# Patient Record
Sex: Female | Born: 1955
Health system: Southern US, Community
[De-identification: ages and names within clinical notes are randomized; demographics above are authoritative.]

## PROBLEM LIST (undated history)

## (undated) DIAGNOSIS — F32A Depression, unspecified: Secondary | ICD-10-CM

## (undated) DIAGNOSIS — Z862 Personal history of diseases of the blood and blood-forming organs and certain disorders involving the immune mechanism: Secondary | ICD-10-CM

## (undated) DIAGNOSIS — C73 Malignant neoplasm of thyroid gland: Secondary | ICD-10-CM

## (undated) DIAGNOSIS — F419 Anxiety disorder, unspecified: Secondary | ICD-10-CM

## (undated) DIAGNOSIS — R1084 Generalized abdominal pain: Secondary | ICD-10-CM

## (undated) DIAGNOSIS — J189 Pneumonia, unspecified organism: Secondary | ICD-10-CM

## (undated) DIAGNOSIS — N289 Disorder of kidney and ureter, unspecified: Secondary | ICD-10-CM

## (undated) DIAGNOSIS — M1990 Unspecified osteoarthritis, unspecified site: Secondary | ICD-10-CM

## (undated) DIAGNOSIS — F40240 Claustrophobia: Secondary | ICD-10-CM

## (undated) DIAGNOSIS — F329 Major depressive disorder, single episode, unspecified: Secondary | ICD-10-CM

## (undated) DIAGNOSIS — J449 Chronic obstructive pulmonary disease, unspecified: Secondary | ICD-10-CM

## (undated) DIAGNOSIS — E079 Disorder of thyroid, unspecified: Secondary | ICD-10-CM

## (undated) DIAGNOSIS — C801 Malignant (primary) neoplasm, unspecified: Secondary | ICD-10-CM

## (undated) DIAGNOSIS — R7303 Prediabetes: Secondary | ICD-10-CM

## (undated) DIAGNOSIS — N800 Endometriosis of uterus: Secondary | ICD-10-CM

## (undated) DIAGNOSIS — K219 Gastro-esophageal reflux disease without esophagitis: Secondary | ICD-10-CM

## (undated) DIAGNOSIS — S62101A Fracture of unspecified carpal bone, right wrist, initial encounter for closed fracture: Secondary | ICD-10-CM

## (undated) DIAGNOSIS — I700 Atherosclerosis of aorta: Secondary | ICD-10-CM

## (undated) HISTORY — PX: ABDOMINAL HYSTERECTOMY: SHX81

## (undated) HISTORY — PX: COLONOSCOPY W/ POLYPECTOMY: SHX1380

## (undated) HISTORY — PX: TOTAL THYROIDECTOMY: SHX2547

## (undated) HISTORY — PX: FRACTURE SURGERY: SHX138

## (undated) HISTORY — PX: TUBAL LIGATION: SHX77

---
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
## 1999-10-06 ENCOUNTER — Ambulatory Visit (HOSPITAL_COMMUNITY): Admission: RE | Admit: 1999-10-06 | Discharge: 1999-10-06 | Payer: Self-pay | Admitting: Surgery

## 1999-10-06 ENCOUNTER — Encounter: Payer: Self-pay | Admitting: Surgery

## 2005-02-17 ENCOUNTER — Other Ambulatory Visit: Admission: RE | Admit: 2005-02-17 | Discharge: 2005-02-17 | Payer: Self-pay | Admitting: Gynecology

## 2005-05-06 ENCOUNTER — Encounter: Admission: RE | Admit: 2005-05-06 | Discharge: 2005-05-06 | Payer: Self-pay | Admitting: Specialist

## 2005-05-16 ENCOUNTER — Other Ambulatory Visit: Admission: RE | Admit: 2005-05-16 | Discharge: 2005-05-16 | Payer: Self-pay | Admitting: Interventional Radiology

## 2005-05-16 ENCOUNTER — Encounter: Admission: RE | Admit: 2005-05-16 | Discharge: 2005-05-16 | Payer: Self-pay | Admitting: Family Medicine

## 2005-06-20 ENCOUNTER — Ambulatory Visit (HOSPITAL_COMMUNITY): Admission: RE | Admit: 2005-06-20 | Discharge: 2005-06-21 | Payer: Self-pay | Admitting: General Surgery

## 2005-08-04 ENCOUNTER — Ambulatory Visit (HOSPITAL_COMMUNITY): Admission: RE | Admit: 2005-08-04 | Discharge: 2005-08-05 | Payer: Self-pay | Admitting: General Surgery

## 2005-10-03 ENCOUNTER — Encounter: Admission: RE | Admit: 2005-10-03 | Discharge: 2005-10-03 | Payer: Self-pay | Admitting: Endocrinology

## 2005-10-13 ENCOUNTER — Encounter: Admission: RE | Admit: 2005-10-13 | Discharge: 2005-10-13 | Payer: Self-pay | Admitting: Endocrinology

## 2005-10-20 ENCOUNTER — Encounter: Admission: RE | Admit: 2005-10-20 | Discharge: 2005-10-20 | Payer: Self-pay | Admitting: Endocrinology

## 2006-12-13 IMAGING — US US SOFT TISSUE HEAD/NECK
1 series · 14 of 25 positions shown · non-contrast
Comparison: none

CLINICAL DATA: Thyroid lesion noted on MR. 
ULTRASOUND SOFT TISSUE, HEAD AND NECK:
Scans over the thyroid gland were performed.  The thyroid gland is within the upper limits of normal.  The right lobe measures 5.5 cm sagittally with a depth of 1.4 cm and width of 2.0 cm.  The left lobe measures 6.2 x 2.5 x 3.1 cm.  The isthmus measures 3.5 mm.  However, within the left mid and lower lobe, there is a rounded mass present which is slightly hypoechoic.  This mass measures 3.8 x 2.0 x 2.9 cm and could represent a complex cyst but, biopsy of this dominant mass is recommended.

[Series 1: unknown · 0.06mm/px · 14 of 36 slices shown]
[im 1/36]
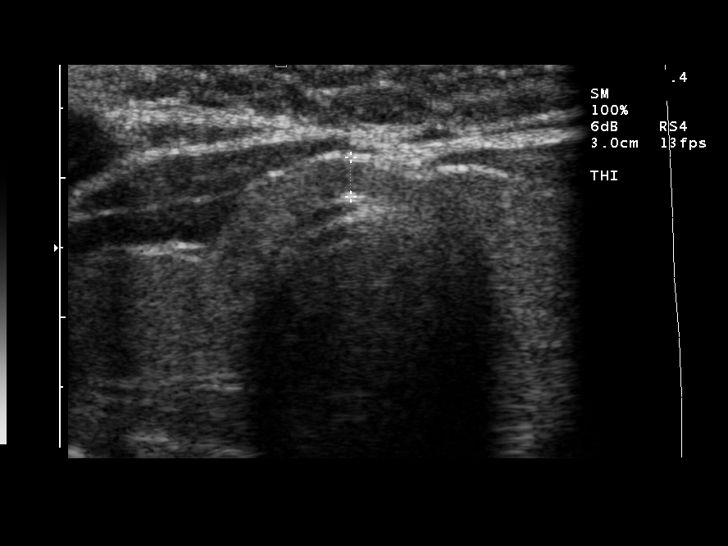
[im 3/36]
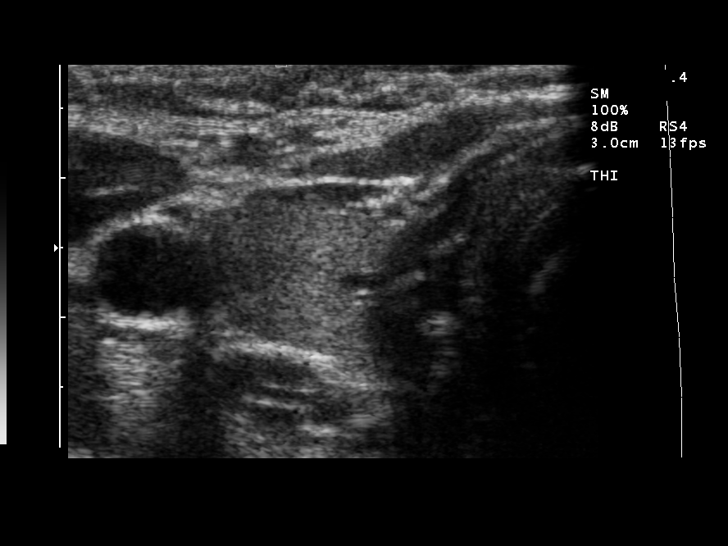
[im 6/36]
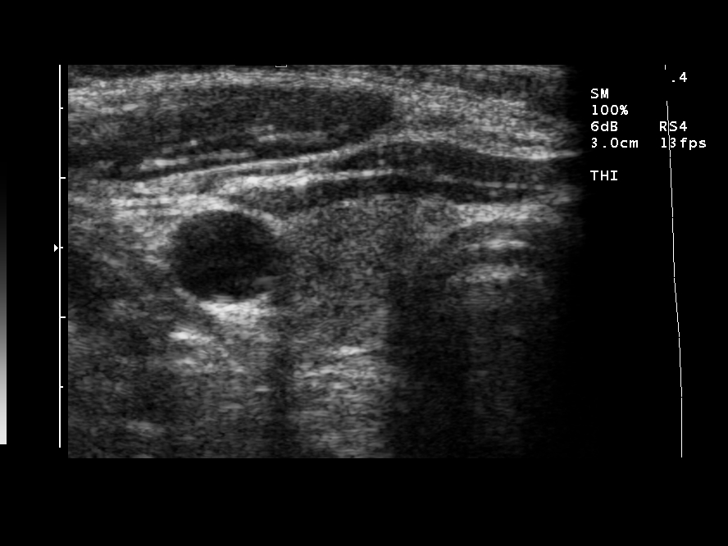
[im 9/36]
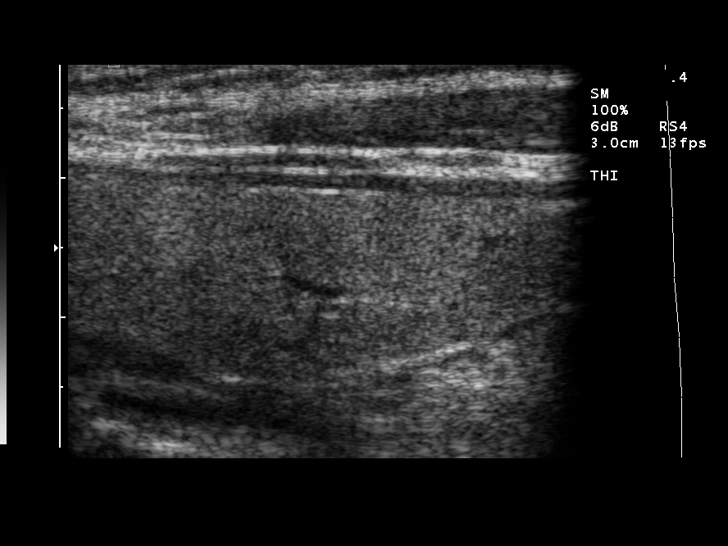
[im 12/36]
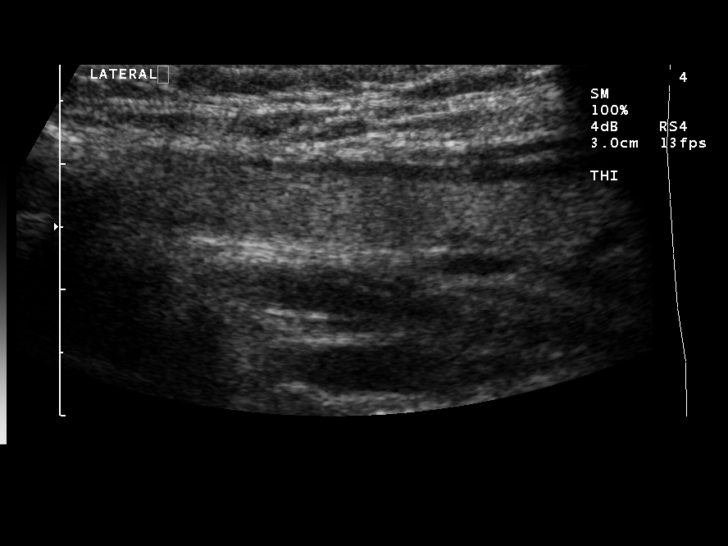
[im 14/36]
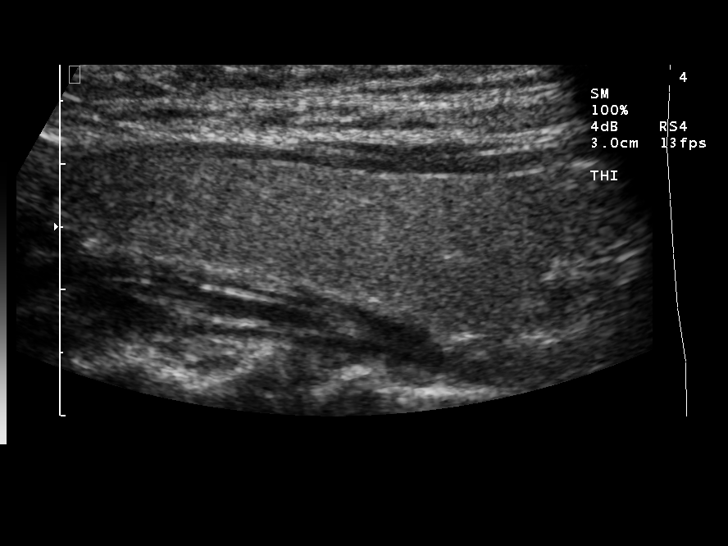
[im 17/36]
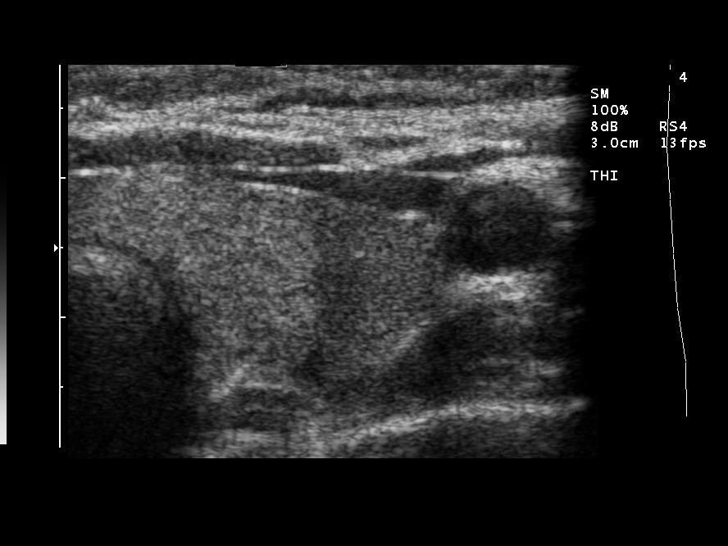
[im 19/36]
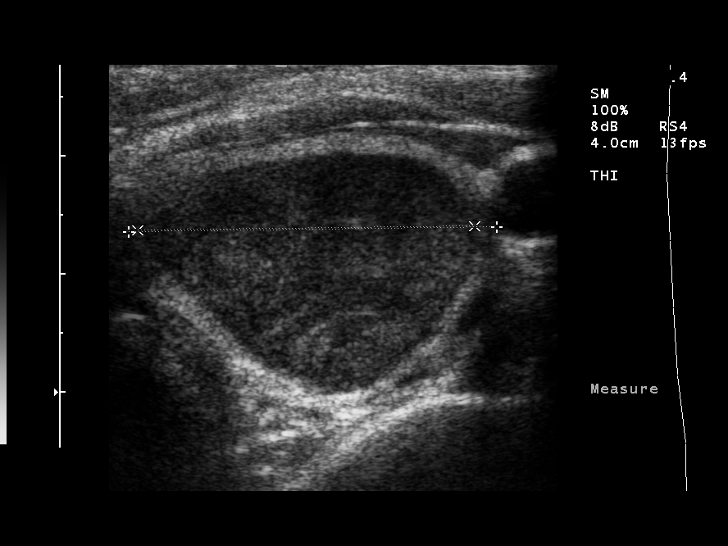
[im 22/36]
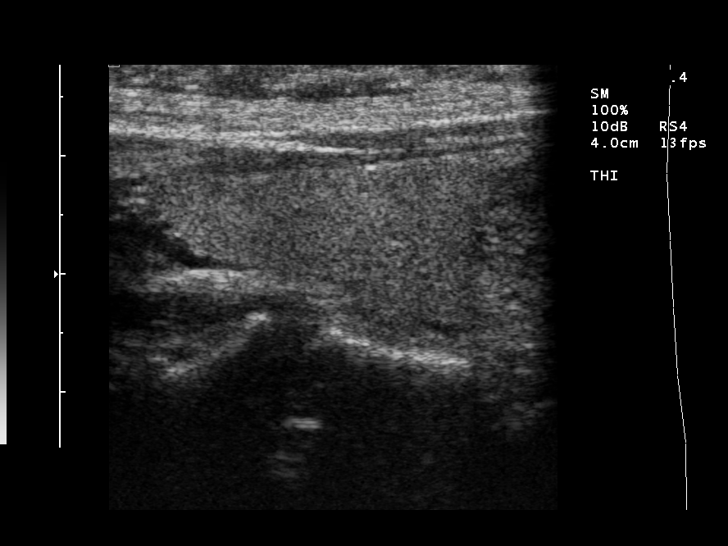
[im 24/36]
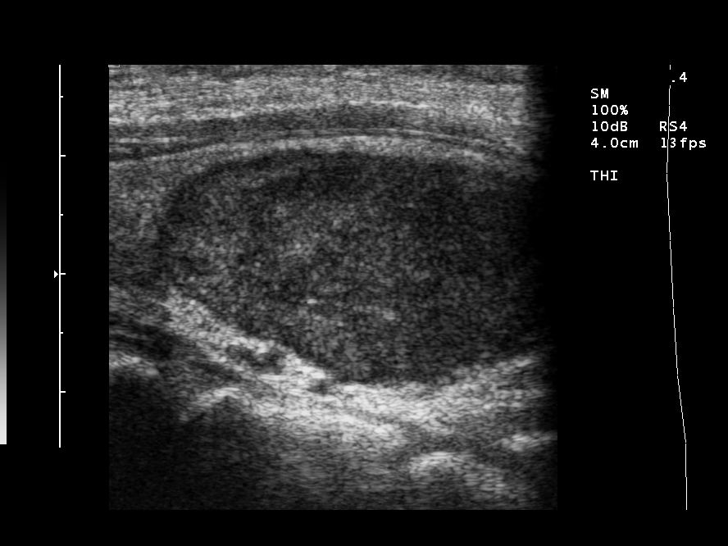
[im 27/36]
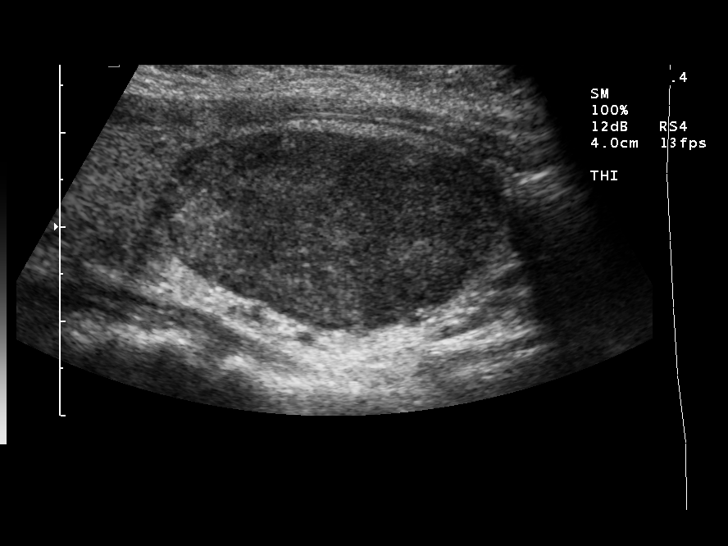
[im 30/36]
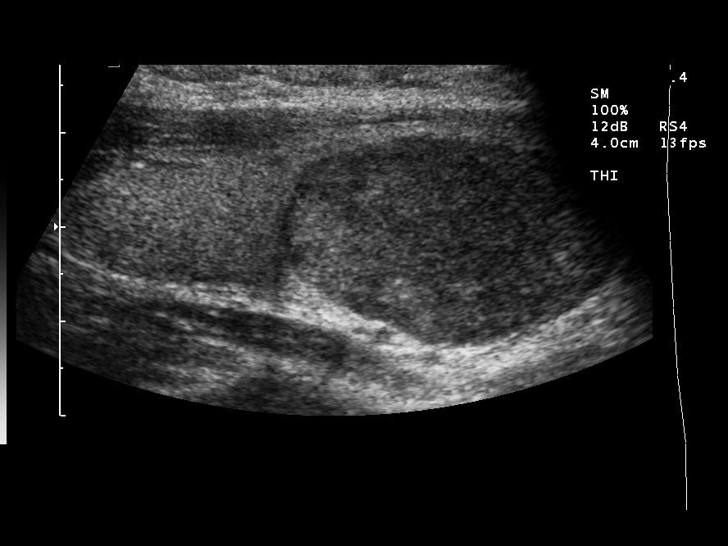
[im 33/36]
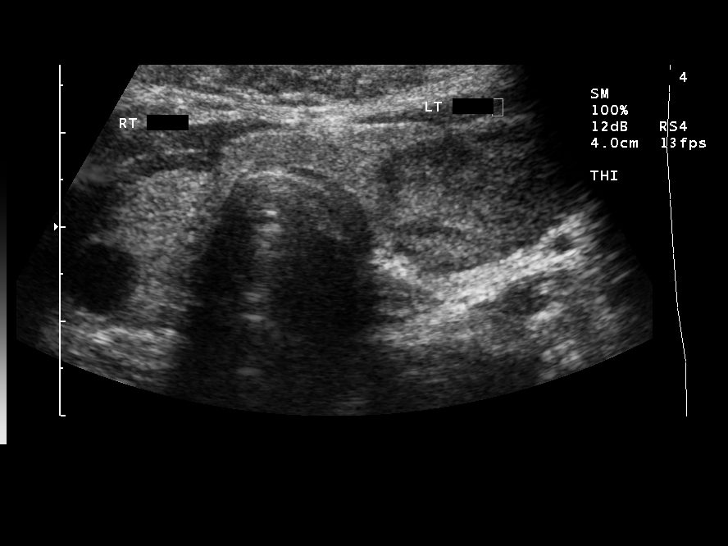
[im 36/36]
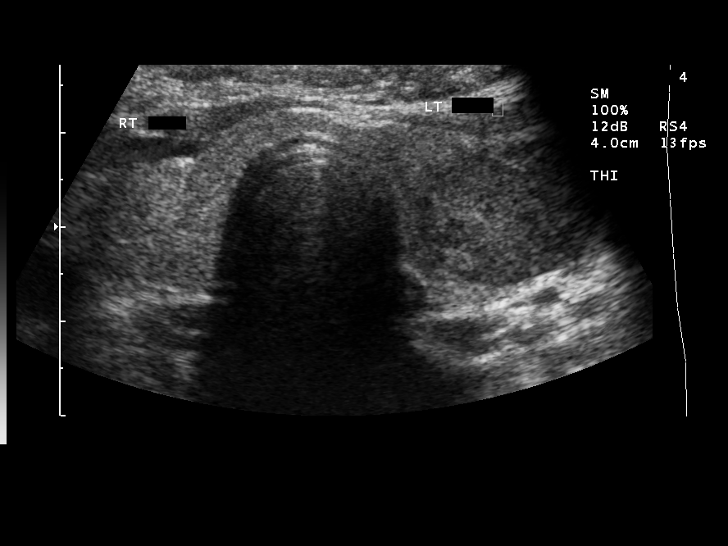

[14 of 25 positions shown; findings below may reference images not displayed]

IMPRESSION: 3.8 x 2.0 x 2.9 cm mass in the left lower pole of the thyroid. Suggest biopsy.

## 2010-08-15 ENCOUNTER — Encounter: Payer: Self-pay | Admitting: Endocrinology

## 2014-07-25 DIAGNOSIS — K5792 Diverticulitis of intestine, part unspecified, without perforation or abscess without bleeding: Secondary | ICD-10-CM

## 2014-07-25 HISTORY — DX: Diverticulitis of intestine, part unspecified, without perforation or abscess without bleeding: K57.92

## 2014-10-21 ENCOUNTER — Other Ambulatory Visit: Payer: Self-pay | Admitting: Family Medicine

## 2014-10-21 DIAGNOSIS — N644 Mastodynia: Secondary | ICD-10-CM

## 2014-10-30 ENCOUNTER — Other Ambulatory Visit: Payer: Self-pay | Admitting: Family Medicine

## 2014-10-30 DIAGNOSIS — N644 Mastodynia: Secondary | ICD-10-CM

## 2014-11-13 ENCOUNTER — Ambulatory Visit
Admission: RE | Admit: 2014-11-13 | Discharge: 2014-11-13 | Disposition: A | Discharge status: Home / Self Care | Payer: BLUE CROSS/BLUE SHIELD | Source: Ambulatory Visit | Attending: Family Medicine | Admitting: Family Medicine

## 2014-11-13 ENCOUNTER — Other Ambulatory Visit: Payer: Self-pay

## 2014-11-13 DIAGNOSIS — N644 Mastodynia: Secondary | ICD-10-CM

## 2015-02-28 ENCOUNTER — Emergency Department (HOSPITAL_COMMUNITY)
Admission: EM | Admit: 2015-02-28 | Discharge: 2015-02-28 | Disposition: A | Discharge status: Home / Self Care | Payer: BLUE CROSS/BLUE SHIELD | Attending: Emergency Medicine | Admitting: Emergency Medicine

## 2015-02-28 ENCOUNTER — Encounter (HOSPITAL_COMMUNITY): Payer: Self-pay | Admitting: *Deleted

## 2015-02-28 ENCOUNTER — Emergency Department (HOSPITAL_COMMUNITY): Payer: BLUE CROSS/BLUE SHIELD

## 2015-02-28 DIAGNOSIS — M545 Low back pain: Secondary | ICD-10-CM | POA: Diagnosis not present

## 2015-02-28 DIAGNOSIS — K5792 Diverticulitis of intestine, part unspecified, without perforation or abscess without bleeding: Secondary | ICD-10-CM | POA: Diagnosis not present

## 2015-02-28 DIAGNOSIS — Z720 Tobacco use: Secondary | ICD-10-CM | POA: Insufficient documentation

## 2015-02-28 DIAGNOSIS — M1990 Unspecified osteoarthritis, unspecified site: Secondary | ICD-10-CM | POA: Diagnosis not present

## 2015-02-28 DIAGNOSIS — R509 Fever, unspecified: Secondary | ICD-10-CM | POA: Diagnosis present

## 2015-02-28 DIAGNOSIS — Z79899 Other long term (current) drug therapy: Secondary | ICD-10-CM | POA: Insufficient documentation

## 2015-02-28 DIAGNOSIS — Z87448 Personal history of other diseases of urinary system: Secondary | ICD-10-CM | POA: Insufficient documentation

## 2015-02-28 DIAGNOSIS — Z8639 Personal history of other endocrine, nutritional and metabolic disease: Secondary | ICD-10-CM | POA: Diagnosis not present

## 2015-02-28 DIAGNOSIS — R109 Unspecified abdominal pain: Secondary | ICD-10-CM

## 2015-02-28 HISTORY — DX: Disorder of kidney and ureter, unspecified: N28.9

## 2015-02-28 HISTORY — DX: Disorder of thyroid, unspecified: E07.9

## 2015-02-28 HISTORY — DX: Unspecified osteoarthritis, unspecified site: M19.90

## 2015-02-28 LAB — URINE MICROSCOPIC-ADD ON

## 2015-02-28 LAB — BASIC METABOLIC PANEL
ANION GAP: 10 (ref 5–15)
BUN: 10 mg/dL (ref 6–20)
CALCIUM: 8.9 mg/dL (ref 8.9–10.3)
CHLORIDE: 102 mmol/L (ref 101–111)
CO2: 22 mmol/L (ref 22–32)
Creatinine, Ser: 0.89 mg/dL (ref 0.44–1.00)
GLUCOSE: 157 mg/dL — AB (ref 65–99)
POTASSIUM: 3.7 mmol/L (ref 3.5–5.1)
Sodium: 134 mmol/L — ABNORMAL LOW (ref 135–145)

## 2015-02-28 LAB — URINALYSIS, ROUTINE W REFLEX MICROSCOPIC
Glucose, UA: NEGATIVE mg/dL
KETONES UR: 15 mg/dL — AB
LEUKOCYTES UA: NEGATIVE
NITRITE: NEGATIVE
PH: 5.5 (ref 5.0–8.0)
Protein, ur: NEGATIVE mg/dL
SPECIFIC GRAVITY, URINE: 1.021 (ref 1.005–1.030)
UROBILINOGEN UA: 0.2 mg/dL (ref 0.0–1.0)

## 2015-02-28 LAB — CBC WITH DIFFERENTIAL/PLATELET
BASOS ABS: 0 10*3/uL (ref 0.0–0.1)
Basophils Relative: 0 % (ref 0–1)
EOS ABS: 0 10*3/uL (ref 0.0–0.7)
EOS PCT: 0 % (ref 0–5)
HEMATOCRIT: 40.6 % (ref 36.0–46.0)
Hemoglobin: 13.8 g/dL (ref 12.0–15.0)
LYMPHS PCT: 5 % — AB (ref 12–46)
Lymphs Abs: 1.1 10*3/uL (ref 0.7–4.0)
MCH: 29.7 pg (ref 26.0–34.0)
MCHC: 34 g/dL (ref 30.0–36.0)
MCV: 87.5 fL (ref 78.0–100.0)
MONO ABS: 0.7 10*3/uL (ref 0.1–1.0)
Monocytes Relative: 3 % (ref 3–12)
NEUTROS PCT: 92 % — AB (ref 43–77)
Neutro Abs: 19.2 10*3/uL — ABNORMAL HIGH (ref 1.7–7.7)
Platelets: 326 10*3/uL (ref 150–400)
RBC: 4.64 MIL/uL (ref 3.87–5.11)
RDW: 15 % (ref 11.5–15.5)
WBC: 21 10*3/uL — AB (ref 4.0–10.5)

## 2015-02-28 LAB — I-STAT CG4 LACTIC ACID, ED: Lactic Acid, Venous: 0.82 mmol/L (ref 0.5–2.0)

## 2015-02-28 MED ORDER — IOHEXOL 300 MG/ML  SOLN
25.0000 mL | Freq: Once | INTRAMUSCULAR | Status: AC | PRN
  Administered 2015-02-28: 25 mL via ORAL

## 2015-02-28 MED ORDER — ACETAMINOPHEN 325 MG PO TABS
650.0000 mg | ORAL_TABLET | Freq: Once | ORAL | Status: AC
  Administered 2015-02-28: 650 mg via ORAL
  Filled 2015-02-28: qty 2

## 2015-02-28 MED ORDER — HYDROCODONE-ACETAMINOPHEN 5-325 MG PO TABS: 1.0000 | ORAL_TABLET | ORAL | Status: DC | PRN

## 2015-02-28 MED ORDER — METRONIDAZOLE 500 MG PO TABS: 500.0000 mg | ORAL_TABLET | Freq: Two times a day (BID) | ORAL | Status: DC

## 2015-02-28 MED ORDER — SODIUM CHLORIDE 0.9 % IV BOLUS (SEPSIS)
1000.0000 mL | Freq: Once | INTRAVENOUS | Status: AC
  Administered 2015-02-28: 1000 mL via INTRAVENOUS

## 2015-02-28 MED ORDER — ONDANSETRON HCL 4 MG PO TABS: 4.0000 mg | ORAL_TABLET | Freq: Four times a day (QID) | ORAL | Status: DC

## 2015-02-28 MED ORDER — CIPROFLOXACIN HCL 500 MG PO TABS: 500.0000 mg | ORAL_TABLET | Freq: Two times a day (BID) | ORAL | Status: DC

## 2015-02-28 MED ORDER — MORPHINE SULFATE 4 MG/ML IJ SOLN
4.0000 mg | Freq: Once | INTRAMUSCULAR | Status: AC
  Administered 2015-02-28: 4 mg via INTRAVENOUS
  Filled 2015-02-28: qty 1

## 2015-02-28 MED ORDER — IOHEXOL 300 MG/ML  SOLN
100.0000 mL | Freq: Once | INTRAMUSCULAR | Status: AC | PRN
  Administered 2015-02-28: 100 mL via INTRAVENOUS

## 2015-02-28 MED ORDER — ONDANSETRON HCL 4 MG/2ML IJ SOLN
4.0000 mg | Freq: Once | INTRAMUSCULAR | Status: AC
  Administered 2015-02-28: 4 mg via INTRAVENOUS
  Filled 2015-02-28: qty 2

## 2015-02-28 MED ORDER — ONDANSETRON 4 MG PO TBDP
4.0000 mg | ORAL_TABLET | Freq: Once | ORAL | Status: AC
  Administered 2015-02-28: 4 mg via ORAL
  Filled 2015-02-28: qty 1

## 2015-02-28 MED ORDER — METRONIDAZOLE 500 MG PO TABS
500.0000 mg | ORAL_TABLET | Freq: Once | ORAL | Status: AC
Start: 2015-02-28 — End: 2015-02-28
  Administered 2015-02-28: 500 mg via ORAL
  Filled 2015-02-28: qty 1

## 2015-02-28 MED ORDER — CIPROFLOXACIN HCL 500 MG PO TABS
500.0000 mg | ORAL_TABLET | Freq: Once | ORAL | Status: AC
  Administered 2015-02-28: 500 mg via ORAL
  Filled 2015-02-28: qty 1

## 2015-02-28 NOTE — ED Provider Notes (Signed)
CSN: [id number]     Arrival date & time 02/28/15  1429 History   First MD Initiated Contact with Patient 02/28/15 1511     Chief Complaint  Patient presents with  . Fever  . Back Pain     (Consider location/radiation/quality/duration/timing/severity/associated sxs/prior Treatment) HPI   PCP: No primary care provider on file. Blood pressure 140/54, pulse 115, temperature 99.7 F (37.6 C), temperature source Oral, resp. rate 18, SpO2 97 %.  Kimberly Barnett is a 59 y.o.female without any significant PMH presents to the ER with complaints of lower back pain, lower abdominal pain, nausea, hot flashes, chills, and dark urine . She has a history of hematuria, assumed from NSAID use, and pyelonephritis. She reports this feels like pyelo.  The symptoms started today and is sharp and intermittent crampy sensations. She describes the pain has sometimes a 3/10 and then at its highest a 7/10. She does not have a urologist or nephrologist here in [place].  The patient denies diaphoresis, vaginal discharge, fever, headache, weakness (general or focal), confusion, change of vision,  neck pain, dysphagia, aphagia, chest pain, shortness of breath,  back pain, vomiting, diarrhea, lower extremity swelling, rash.  Past Medical History  Diagnosis Date  . Renal disorder   . Thyroid disease   . Arthritis    History reviewed. No pertinent past surgical history. History reviewed. No pertinent family history. History  Substance Use Topics  . Smoking status: Current Every Day Smoker    Types: Cigarettes  . Smokeless tobacco: Not on file  . Alcohol Use: Yes     Comment: occ   OB History    No data available     Review of Systems  10 Systems reviewed and are negative for acute change except as noted in the HPI.   Allergies  Nsaids  Home Medications   Prior to Admission medications   Medication Sig Start Date End Date Taking? Authorizing Provider  acetaminophen (TYLENOL) 650 MG CR tablet Take  1,300 mg by mouth every 8 (eight) hours as needed for pain.   Yes Historical Provider, MD  b complex vitamins tablet Take 1 tablet by mouth daily.   Yes Historical Provider, MD  Cholecalciferol (VITAMIN D3) 5000 UNITS TABS Take 5,000 Units by mouth daily.   Yes Historical Provider, MD  Cyanocobalamin (B-12 PO) Take 1 tablet by mouth daily.   Yes Historical Provider, MD  estradiol (VIVELLE-DOT) 0.0375 MG/24HR Place 1 patch onto the skin 2 (two) times a week. Nancy Fetter and wed changes 02/19/15  Yes Historical Provider, MD  Probiotic CAPS Take 1-2 capsules by mouth daily.   Yes Historical Provider, MD  SYNTHROID 125 MCG tablet Take 125 mcg by mouth daily. 02/11/15  Yes Historical Provider, MD  VITAMIN A PO Take 1 capsule by mouth daily.   Yes Historical Provider, MD  VITAMIN K PO Take 1 tablet by mouth daily.   Yes Historical Provider, MD  Wheat Dextrin (BENEFIBER DRINK MIX PO) Take 1 Package by mouth daily.   Yes Historical Provider, MD  ciprofloxacin (CIPRO) 500 MG tablet Take 1 tablet (500 mg total) by mouth 2 (two) times daily. 02/28/15   Delos Haring, PA-C  HYDROcodone-acetaminophen (NORCO/VICODIN) 5-325 MG per tablet Take 1-2 tablets by mouth every 4 (four) hours as needed. 02/28/15   Lei Dower Carlota Raspberry, PA-C  metroNIDAZOLE (FLAGYL) 500 MG tablet Take 1 tablet (500 mg total) by mouth 2 (two) times daily. 02/28/15   Delos Haring, PA-C  ondansetron (ZOFRAN) 4 MG tablet Take  1 tablet (4 mg total) by mouth every 6 (six) hours. 02/28/15   Kip Cropp Carlota Raspberry, PA-C   BP 115/73 mmHg  Pulse 95  Temp(Src) 99.7 F (37.6 C) (Oral)  Resp 20  SpO2 92% Physical Exam  Constitutional: She appears well-developed and well-nourished. No distress.  HENT:  Head: Normocephalic and atraumatic.  Eyes: Pupils are equal, round, and reactive to light.  Neck: Normal range of motion. Neck supple.  Cardiovascular: Normal rate and regular rhythm.   Pulmonary/Chest: Effort normal.  Abdominal: Soft. Bowel sounds are normal. There is  tenderness in the suprapubic area. There is CVA tenderness. There is no rigidity, no rebound and no guarding.  Neurological: She is alert.  Skin: Skin is warm and dry.  Nursing note and vitals reviewed.  ED Course  Procedures (including critical care time) Labs Review Labs Reviewed  URINALYSIS, ROUTINE W REFLEX MICROSCOPIC (NOT AT [facility]) - Abnormal; Notable for the following:    Color, Urine AMBER (*)    APPearance TURBID (*)    Hgb urine dipstick SMALL (*)    Bilirubin Urine SMALL (*)    Ketones, ur 15 (*)    All other components within normal limits  CBC WITH DIFFERENTIAL/PLATELET - Abnormal; Notable for the following:    WBC 21.0 (*)    Neutrophils Relative % 92 (*)    Neutro Abs 19.2 (*)    Lymphocytes Relative 5 (*)    All other components within normal limits  BASIC METABOLIC PANEL - Abnormal; Notable for the following:    Sodium 134 (*)    Glucose, Bld 157 (*)    All other components within normal limits  URINE MICROSCOPIC-ADD ON - Abnormal; Notable for the following:    Squamous Epithelial / LPF FEW (*)    Bacteria, UA FEW (*)    All other components within normal limits  I-STAT CG4 LACTIC ACID, ED    Imaging Review Ct Abdomen Pelvis W Contrast  02/28/2015   CLINICAL DATA:  Fever, chills, nausea, vomiting, lower abdominal pain, and low back pain since 0900 am this morning.Hx: Thyroid disease  EXAM: CT ABDOMEN AND PELVIS WITH CONTRAST  TECHNIQUE: Multidetector CT imaging of the abdomen and pelvis was performed using the standard protocol following bolus administration of intravenous contrast.  CONTRAST:  166mL OMNIPAQUE IOHEXOL 300 MG/ML  SOLN  COMPARISON:  None.  FINDINGS: Lung bases:  Clear.  Heart normal in size.  Liver, spleen, gallbladder:  Normal.  Pancreas: Well-defined oval 16 mm cyst in the tail the pancreas. No other pancreatic masses or lesions. No pancreatic inflammation.  Adrenal glands: Left adrenal gland is enlarged. There are heterogeneous masses enlarging both  limbs, the anterior limb measuring 2.4 cm in the posterior medial and 2.3 cm. These are intermediate in attenuation, the anterior limb measuring 25 Hounsfield units and posteriorly measuring 44 Hounsfield units. Both show significant washout on the delayed sequence, the anterior limb measuring 17.5 Hounsfield units in the posterior limb 14 Hounsfield units. This strongly supports both of these as being adenomas with washout of greater in 60% for each lesion. Normal right adrenal gland.  Kidneys, ureters, bladder:  Normal.  Uterus and adnexa:  Uterus surgically absent.  No adnexal masses.  Lymph nodes:  No enlarged lymph nodes.  Ascites:  None.  Gastrointestinal: There is mild hazy opacity in the pericolonic fat just posterior to the cecum with a few small nodular opacities consistent with sub cm lymph nodes. The wall of the cecum in this location appears mildly  thickened and irregular. No discrete diverticulum is seen. The appendix is retrocecal and separate from the apparent inflammation. It is normal in size containing air and demonstrating a thin wall. There are few left colon diverticula without evidence of diverticulitis. Colon otherwise unremarkable. Stomach and small bowel are unremarkable.  Musculoskeletal: Mild degenerative changes of the visualized spine. Grade 1 anterolisthesis of L4 on L5. No osteoblastic or osteolytic lesions.  IMPRESSION: 1. Mild inflammatory changes noted along the posterior margin of the cecum. The etiology of this is unclear. Localize infection or inflammation is suspected. This could reflect mild right colon diverticulitis although no discrete diverticulum is seen. 2. Normal appendix visualized. 3. No other evidence of an acute abnormality. 4. Left adrenal masses consistent with adenomas based on their rapid washout of intravenous contrast. 5. Several left colon diverticula without additional areas of inflammation. 6. Status post hysterectomy.   Electronically Signed   By: Lajean Manes M.D.   On: 02/28/2015 17:45     EKG Interpretation None      MDM   Final diagnoses:  Abdominal pain  Diverticulitis of intestine without perforation or abscess without bleeding    The patient's blood work showed an elevated white blood cell count, negative lactic acid. Her glucose is slightly elevated at 157. Her urine shows small amount of hemoglobin and ketones but no significant findings suggestive of UTI. Her CT scan shows possible focal infection, radiologist suspects could be diverticulitis although no discrete diverticulum noticed. Patient has no other acute findings within the abdomen. She'll be referred to a gastroenterologist  Medications  sodium chloride 0.9 % bolus 1,000 mL (1,000 mLs Intravenous New Bag/Given 02/28/15 1611)  ondansetron (ZOFRAN) injection 4 mg (4 mg Intravenous Given 02/28/15 1611)  morphine 4 MG/ML injection 4 mg (4 mg Intravenous Given 02/28/15 1610)  acetaminophen (TYLENOL) tablet 650 mg (650 mg Oral Given 02/28/15 1611)  iohexol (OMNIPAQUE) 300 MG/ML solution 25 mL (25 mLs Oral Contrast Given 02/28/15 1651)  iohexol (OMNIPAQUE) 300 MG/ML solution 100 mL (100 mLs Intravenous Contrast Given 02/28/15 1712)  ciprofloxacin (CIPRO) tablet 500 mg (500 mg Oral Given 02/28/15 1835)  metroNIDAZOLE (FLAGYL) tablet 500 mg (500 mg Oral Given 02/28/15 1835)  ondansetron (ZOFRAN-ODT) disintegrating tablet 4 mg (4 mg Oral Given 02/28/15 1835)    59 y.o.Eryn Marandola Sermons's evaluation in the Emergency Department is complete. It has been determined that no acute conditions requiring further emergency intervention are present at this time. The patient/guardian have been advised of the diagnosis and plan. We have discussed signs and symptoms that warrant return to the ED, such as changes or worsening in symptoms.  Vital signs are stable at discharge. Filed Vitals:   02/28/15 1815  BP: 115/73  Pulse: 95  Temp:   Resp:     Patient/guardian has voiced understanding and agreed  to follow-up with the PCP or specialist.     Delos Haring, PA-C 02/28/15 1850  Evelina Bucy, MD 02/28/15 2317

## 2015-02-28 NOTE — Discharge Instructions (Signed)

## 2015-02-28 NOTE — ED Notes (Signed)
Pt reports onset today of lower back pain and lower abd pain, having chills and fever today.

## 2015-04-06 ENCOUNTER — Other Ambulatory Visit: Payer: Self-pay | Admitting: Gastroenterology

## 2015-05-06 ENCOUNTER — Other Ambulatory Visit: Payer: Self-pay | Admitting: Gastroenterology

## 2015-05-06 NOTE — Addendum Note (Signed)
Addended by: Arta Silence on: 05/06/2015 11:39 AM   Modules accepted: Orders

## 2015-05-14 ENCOUNTER — Encounter (HOSPITAL_COMMUNITY): Payer: Self-pay | Admitting: *Deleted

## 2015-05-19 ENCOUNTER — Other Ambulatory Visit: Payer: Self-pay | Admitting: Gastroenterology

## 2015-05-20 ENCOUNTER — Ambulatory Visit (HOSPITAL_COMMUNITY): Payer: BLUE CROSS/BLUE SHIELD | Admitting: Anesthesiology

## 2015-05-20 ENCOUNTER — Encounter (HOSPITAL_COMMUNITY)
Admission: RE | Disposition: A | Discharge status: Home / Self Care | Payer: Self-pay | Source: Ambulatory Visit | Attending: Gastroenterology

## 2015-05-20 ENCOUNTER — Ambulatory Visit (HOSPITAL_COMMUNITY)
Admission: RE | Admit: 2015-05-20 | Discharge: 2015-05-20 | Disposition: A | Discharge status: Home / Self Care | Payer: BLUE CROSS/BLUE SHIELD | Source: Ambulatory Visit | Attending: Gastroenterology | Admitting: Gastroenterology

## 2015-05-20 ENCOUNTER — Encounter (HOSPITAL_COMMUNITY): Payer: Self-pay

## 2015-05-20 DIAGNOSIS — M479 Spondylosis, unspecified: Secondary | ICD-10-CM | POA: Diagnosis not present

## 2015-05-20 DIAGNOSIS — K862 Cyst of pancreas: Secondary | ICD-10-CM | POA: Diagnosis not present

## 2015-05-20 DIAGNOSIS — M19072 Primary osteoarthritis, left ankle and foot: Secondary | ICD-10-CM | POA: Insufficient documentation

## 2015-05-20 DIAGNOSIS — J449 Chronic obstructive pulmonary disease, unspecified: Secondary | ICD-10-CM | POA: Diagnosis not present

## 2015-05-20 DIAGNOSIS — M19071 Primary osteoarthritis, right ankle and foot: Secondary | ICD-10-CM | POA: Insufficient documentation

## 2015-05-20 DIAGNOSIS — M19042 Primary osteoarthritis, left hand: Secondary | ICD-10-CM | POA: Diagnosis not present

## 2015-05-20 DIAGNOSIS — Z79899 Other long term (current) drug therapy: Secondary | ICD-10-CM | POA: Insufficient documentation

## 2015-05-20 DIAGNOSIS — M19041 Primary osteoarthritis, right hand: Secondary | ICD-10-CM | POA: Diagnosis not present

## 2015-05-20 DIAGNOSIS — F17210 Nicotine dependence, cigarettes, uncomplicated: Secondary | ICD-10-CM | POA: Diagnosis not present

## 2015-05-20 DIAGNOSIS — R1909 Other intra-abdominal and pelvic swelling, mass and lump: Secondary | ICD-10-CM | POA: Diagnosis present

## 2015-05-20 HISTORY — PX: FINE NEEDLE ASPIRATION: SHX5430

## 2015-05-20 HISTORY — PX: EUS: SHX5427

## 2015-05-20 HISTORY — DX: Malignant (primary) neoplasm, unspecified: C80.1

## 2015-05-20 LAB — PANC CYST FLD ANLYS-PATHFNDR-TG

## 2015-05-20 SURGERY — ESOPHAGEAL ENDOSCOPIC ULTRASOUND (EUS) RADIAL
Anesthesia: Monitor Anesthesia Care

## 2015-05-20 MED ORDER — PROPOFOL 500 MG/50ML IV EMUL
INTRAVENOUS | Status: DC | PRN
  Administered 2015-05-20: 140 ug/kg/min via INTRAVENOUS

## 2015-05-20 MED ORDER — PROMETHAZINE HCL 25 MG/ML IJ SOLN: 6.2500 mg | INTRAMUSCULAR | Status: DC | PRN

## 2015-05-20 MED ORDER — FENTANYL CITRATE (PF) 100 MCG/2ML IJ SOLN
INTRAMUSCULAR | Status: DC | PRN
  Administered 2015-05-20: 50 ug via INTRAVENOUS

## 2015-05-20 MED ORDER — FENTANYL CITRATE (PF) 100 MCG/2ML IJ SOLN
INTRAMUSCULAR | Status: AC
Start: 2015-05-20 — End: 2015-05-20
  Filled 2015-05-20: qty 4

## 2015-05-20 MED ORDER — PROPOFOL 10 MG/ML IV BOLUS
INTRAVENOUS | Status: AC
  Filled 2015-05-20: qty 20

## 2015-05-20 MED ORDER — SODIUM CHLORIDE 0.9 % IV SOLN: INTRAVENOUS | Status: DC

## 2015-05-20 MED ORDER — CIPROFLOXACIN IN D5W 400 MG/200ML IV SOLN
INTRAVENOUS | Status: AC
  Filled 2015-05-20: qty 200

## 2015-05-20 MED ORDER — CIPROFLOXACIN IN D5W 400 MG/200ML IV SOLN
400.0000 mg | Freq: Once | INTRAVENOUS | Status: AC
  Administered 2015-05-20: 400 mg via INTRAVENOUS

## 2015-05-20 MED ORDER — BUTAMBEN-TETRACAINE-BENZOCAINE 2-2-14 % EX AERO
INHALATION_SPRAY | CUTANEOUS | Status: DC | PRN
  Administered 2015-05-20: 2 via TOPICAL

## 2015-05-20 MED ORDER — LIDOCAINE HCL (CARDIAC) 20 MG/ML IV SOLN
INTRAVENOUS | Status: DC | PRN
  Administered 2015-05-20: 100 mg via INTRAVENOUS

## 2015-05-20 MED ORDER — LIDOCAINE HCL (CARDIAC) 20 MG/ML IV SOLN
INTRAVENOUS | Status: AC
  Filled 2015-05-20: qty 5

## 2015-05-20 MED ORDER — LACTATED RINGERS IV SOLN: INTRAVENOUS | Status: DC

## 2015-05-20 MED ORDER — CIPROFLOXACIN HCL 500 MG PO TABS: 500.0000 mg | ORAL_TABLET | Freq: Two times a day (BID) | ORAL | Status: DC

## 2015-05-20 NOTE — Addendum Note (Signed)
Addended by: Arta Silence on: 05/20/2015 09:24 AM   Modules accepted: Orders

## 2015-05-20 NOTE — H&P (Signed)
Patient interval history reviewed.  Patient examined again.  There has been no change from documented H/P dated 04/24/15 (scanned into chart from our office) except as documented above.  Assessment:  1.  Pancreatic cyst.  Plan:  1.  Endoscopic ultrasound with possible fine needle cyst aspiration. 2.  Risks (bleeding, infection, bowel perforation that could require surgery, sedation-related changes in cardiopulmonary systems), benefits (identification and possible treatment of source of symptoms, exclusion of certain causes of symptoms), and alternatives (watchful waiting, radiographic imaging studies, empiric medical treatment) of upper endoscopy with ultrasound and possible cyst aspiration (EUS +/- FNA) were explained to patient/family in detail and patient wishes to proceed.

## 2015-05-20 NOTE — Anesthesia Preprocedure Evaluation (Addendum)
Anesthesia Evaluation  Patient identified by MRN, date of birth, ID band Patient awake    Reviewed: Allergy & Precautions, NPO status , Patient's Chart, lab work & pertinent test results  Airway Mallampati: II  TM Distance: >3 FB Neck ROM: Full    Dental no notable dental hx. (+) Edentulous Upper   Pulmonary Current Smoker,    Pulmonary exam normal breath sounds clear to auscultation       Cardiovascular negative cardio ROS Normal cardiovascular exam Rhythm:Regular Rate:Normal     Neuro/Psych negative neurological ROS  negative psych ROS   GI/Hepatic negative GI ROS, Neg liver ROS,   Endo/Other  negative endocrine ROS  Renal/GU negative Renal ROS  negative genitourinary   Musculoskeletal negative musculoskeletal ROS (+)   Abdominal   Peds negative pediatric ROS (+)  Hematology negative hematology ROS (+)   Anesthesia Other Findings   Reproductive/Obstetrics negative OB ROS                           Anesthesia Physical Anesthesia Plan  ASA: II  Anesthesia Plan: MAC   Post-op Pain Management:    Induction:   Airway Management Planned: Natural Airway  Additional Equipment:   Intra-op Plan:   Post-operative Plan:   Informed Consent: I have reviewed the patients History and Physical, chart, labs and discussed the procedure including the risks, benefits and alternatives for the proposed anesthesia with the patient or authorized representative who has indicated his/her understanding and acceptance.   Dental advisory given  Plan Discussed with: CRNA  Anesthesia Plan Comments:         Anesthesia Quick Evaluation

## 2015-05-20 NOTE — Anesthesia Postprocedure Evaluation (Signed)
  Anesthesia Post-op Note  Patient: Kimberly Barnett  Procedure(s) Performed: Procedure(s) (LRB): ESOPHAGEAL ENDOSCOPIC ULTRASOUND (EUS) RADIAL (N/A) FINE NEEDLE ASPIRATION (FNA) RADIAL (N/A)  Patient Location: PACU  Anesthesia Type: MAC  Level of Consciousness: awake and alert   Airway and Oxygen Therapy: Patient Spontanous Breathing  Post-op Pain: mild  Post-op Assessment: Post-op Vital signs reviewed, Patient's Cardiovascular Status Stable, Respiratory Function Stable, Patent Airway and No signs of Nausea or vomiting  Last Vitals:  Filed Vitals:   05/20/15 1140  BP: 181/107  Pulse: 64  Temp:   Resp: 8    Post-op Vital Signs: stable   Complications: No apparent anesthesia complications

## 2015-05-20 NOTE — Op Note (Signed)
[place], [address]   ENDOSCOPIC ULTRASOUND PROCEDURE REPORT  PATIENT: Kimberly Barnett, Kimberly Barnett  MR#: [id number] BIRTHDATE: 11/28/1955  GENDER: female ENDOSCOPIST: Arta Silence, MD REFERRED BY:  Clarene Essex, M.D. PROCEDURE DATE:  05/20/2015 PROCEDURE:   Upper EUS w/FNA ASA CLASS:      Class II INDICATIONS:   1.  pancreatic cyst. MEDICATIONS: Monitored anesthesia care, ciprofloxacin 400 mg IV  DESCRIPTION OF PROCEDURE:   After the risks benefits and alternatives of the procedure were  explained, informed consent was obtained. The patient was then placed in the left, lateral, decubitus postion and IV sedation was administered. Throughout the procedure, the patients blood pressure, pulse and oxygen saturations were monitored continuously.  Under direct visualization, the Pentax Linear [id number]  endoscope was introduced through the mouth  and advanced to the bulb of duodenum .  Water was used as necessary to provide an acoustic interface. Estimated blood loss is zero unless otherwise noted in this procedure report. Upon completion of the imaging, water was removed and the patient was sent to the recovery room in satisfactory condition.    FINDINGS:      38mm x 34mm cyst seen in body of pancreas.  The cyst had a modestly thickened rind, but there were no septations, solid components or mural nodularity.  Remainder of pancreas was normal; no pancreatic mass, chronic pancreatitis, peripancreatic adenopathy, or pancreatic ductal dilatation was identified.  Bile duct was normal caliber.  Cyst was aspirated to collapse after puncture with 22-g needle, and fluid was sent in its entirety for analysis.  IMPRESSION:     Cyst in body of pancreas, aspirated.  RECOMMENDATIONS:     1.  Watch for potential complications of procedure. 2.  Ciprofloxacin 500 mg po bid x 5 days. 3.  Await cyst fluid results. 4.  Follow-up with Dr. Watt Climes for ongoing management  of her GI symptoms.   _______________________________ Lorrin MaisArta Silence, MD 05/20/2015 11:01 AM   CC:

## 2015-05-20 NOTE — Discharge Instructions (Signed)

## 2015-05-20 NOTE — Transfer of Care (Signed)
Immediate Anesthesia Transfer of Care Note  Patient: Kimberly Barnett  Procedure(s) Performed: Procedure(s): ESOPHAGEAL ENDOSCOPIC ULTRASOUND (EUS) RADIAL (N/A) FINE NEEDLE ASPIRATION (FNA) RADIAL (N/A)  Patient Location: PACU and Endoscopy Unit  Anesthesia Type:MAC  Level of Consciousness: awake, sedated and responds to stimulation  Airway & Oxygen Therapy: Patient Spontanous Breathing and Patient connected to nasal cannula oxygen  Post-op Assessment: Report given to RN and Post -op Vital signs reviewed and stable  Post vital signs: Reviewed and stable  Last Vitals:  Filed Vitals:   05/20/15 0911  BP: 158/94  Pulse: 69  Temp: 36.7 C  Resp: 8    Complications: No apparent anesthesia complications

## 2015-05-21 ENCOUNTER — Encounter (HOSPITAL_COMMUNITY): Payer: Self-pay | Admitting: Gastroenterology

## 2015-05-21 NOTE — Addendum Note (Signed)
Addendum  created 05/21/15 1623 by Sharlette Dense, CRNA   Modules edited: Anesthesia Attestations

## 2015-11-02 ENCOUNTER — Other Ambulatory Visit: Payer: Self-pay

## 2015-11-02 DIAGNOSIS — Z1231 Encounter for screening mammogram for malignant neoplasm of breast: Secondary | ICD-10-CM

## 2015-11-09 DIAGNOSIS — Z0000 Encounter for general adult medical examination without abnormal findings: Secondary | ICD-10-CM | POA: Diagnosis not present

## 2015-11-10 ENCOUNTER — Other Ambulatory Visit (HOSPITAL_COMMUNITY): Payer: Self-pay | Admitting: Respiratory Therapy

## 2015-11-10 DIAGNOSIS — R0602 Shortness of breath: Secondary | ICD-10-CM

## 2015-11-18 ENCOUNTER — Ambulatory Visit (HOSPITAL_COMMUNITY)
Admission: RE | Admit: 2015-11-18 | Discharge: 2015-11-18 | Disposition: A | Discharge status: Home / Self Care | Payer: BLUE CROSS/BLUE SHIELD | Source: Ambulatory Visit | Attending: Family Medicine | Admitting: Family Medicine

## 2015-11-18 ENCOUNTER — Ambulatory Visit
Admission: RE | Admit: 2015-11-18 | Discharge: 2015-11-18 | Disposition: A | Discharge status: Home / Self Care | Payer: BLUE CROSS/BLUE SHIELD | Source: Ambulatory Visit

## 2015-11-18 DIAGNOSIS — R0602 Shortness of breath: Secondary | ICD-10-CM | POA: Diagnosis not present

## 2015-11-18 DIAGNOSIS — Z1231 Encounter for screening mammogram for malignant neoplasm of breast: Secondary | ICD-10-CM | POA: Diagnosis not present

## 2015-11-18 LAB — PULMONARY FUNCTION TEST
DL/VA % pred: 75 %
DL/VA: 3.8 ml/min/mmHg/L
DLCO unc % pred: 63 %
DLCO unc: 17.07 ml/min/mmHg
FEF 25-75 PRE: 2.3 L/s
FEF 25-75 Post: 2.64 L/sec
FEF2575-%CHANGE-POST: 14 %
FEF2575-%Pred-Post: 102 %
FEF2575-%Pred-Pre: 89 %
FEV1-%Change-Post: 2 %
FEV1-%PRED-POST: 90 %
FEV1-%Pred-Pre: 88 %
FEV1-POST: 2.55 L
FEV1-PRE: 2.49 L
FEV1FVC-%CHANGE-POST: 4 %
FEV1FVC-%Pred-Pre: 101 %
FEV6-%CHANGE-POST: -2 %
FEV6-%Pred-Post: 87 %
FEV6-%Pred-Pre: 89 %
FEV6-Post: 3.06 L
FEV6-Pre: 3.12 L
FEV6FVC-%Change-Post: 0 %
FEV6FVC-%PRED-PRE: 103 %
FEV6FVC-%Pred-Post: 103 %
FVC-%CHANGE-POST: -2 %
FVC-%PRED-PRE: 86 %
FVC-%Pred-Post: 84 %
FVC-POST: 3.06 L
FVC-Pre: 3.12 L
POST FEV6/FVC RATIO: 100 %
PRE FEV6/FVC RATIO: 100 %
Post FEV1/FVC ratio: 83 %
Pre FEV1/FVC ratio: 80 %
RV % pred: 78 %
RV: 1.6 L
TLC % pred: 86 %
TLC: 4.63 L

## 2015-11-18 MED ORDER — ALBUTEROL SULFATE (2.5 MG/3ML) 0.083% IN NEBU
2.5000 mg | INHALATION_SOLUTION | Freq: Once | RESPIRATORY_TRACT | Status: AC
  Administered 2015-11-18: 2.5 mg via RESPIRATORY_TRACT

## 2015-11-27 ENCOUNTER — Other Ambulatory Visit: Payer: Self-pay | Admitting: Family Medicine

## 2015-11-27 DIAGNOSIS — R928 Other abnormal and inconclusive findings on diagnostic imaging of breast: Secondary | ICD-10-CM

## 2015-12-01 DIAGNOSIS — E890 Postprocedural hypothyroidism: Secondary | ICD-10-CM | POA: Diagnosis not present

## 2015-12-03 ENCOUNTER — Ambulatory Visit
Admission: RE | Admit: 2015-12-03 | Discharge: 2015-12-03 | Disposition: A | Discharge status: Home / Self Care | Payer: BLUE CROSS/BLUE SHIELD | Source: Ambulatory Visit | Attending: Family Medicine | Admitting: Family Medicine

## 2015-12-03 DIAGNOSIS — N6001 Solitary cyst of right breast: Secondary | ICD-10-CM | POA: Diagnosis not present

## 2015-12-03 DIAGNOSIS — R928 Other abnormal and inconclusive findings on diagnostic imaging of breast: Secondary | ICD-10-CM

## 2015-12-03 DIAGNOSIS — N63 Unspecified lump in breast: Secondary | ICD-10-CM | POA: Diagnosis not present

## 2015-12-07 DIAGNOSIS — Z85850 Personal history of malignant neoplasm of thyroid: Secondary | ICD-10-CM | POA: Diagnosis not present

## 2015-12-07 DIAGNOSIS — E890 Postprocedural hypothyroidism: Secondary | ICD-10-CM | POA: Diagnosis not present

## 2016-01-20 DIAGNOSIS — K862 Cyst of pancreas: Secondary | ICD-10-CM | POA: Diagnosis not present

## 2016-03-08 DIAGNOSIS — E890 Postprocedural hypothyroidism: Secondary | ICD-10-CM | POA: Diagnosis not present

## 2016-03-10 DIAGNOSIS — Z85850 Personal history of malignant neoplasm of thyroid: Secondary | ICD-10-CM | POA: Diagnosis not present

## 2016-03-10 DIAGNOSIS — E890 Postprocedural hypothyroidism: Secondary | ICD-10-CM | POA: Diagnosis not present

## 2016-04-06 DIAGNOSIS — M5127 Other intervertebral disc displacement, lumbosacral region: Secondary | ICD-10-CM | POA: Diagnosis not present

## 2016-04-06 DIAGNOSIS — M25562 Pain in left knee: Secondary | ICD-10-CM | POA: Diagnosis not present

## 2016-04-06 DIAGNOSIS — M5137 Other intervertebral disc degeneration, lumbosacral region: Secondary | ICD-10-CM | POA: Diagnosis not present

## 2016-04-06 DIAGNOSIS — M25561 Pain in right knee: Secondary | ICD-10-CM | POA: Diagnosis not present

## 2016-04-06 DIAGNOSIS — M25551 Pain in right hip: Secondary | ICD-10-CM | POA: Diagnosis not present

## 2016-04-06 DIAGNOSIS — M25552 Pain in left hip: Secondary | ICD-10-CM | POA: Diagnosis not present

## 2016-04-06 DIAGNOSIS — M542 Cervicalgia: Secondary | ICD-10-CM | POA: Diagnosis not present

## 2016-04-06 DIAGNOSIS — M461 Sacroiliitis, not elsewhere classified: Secondary | ICD-10-CM | POA: Diagnosis not present

## 2016-04-06 DIAGNOSIS — M545 Low back pain: Secondary | ICD-10-CM | POA: Diagnosis not present

## 2016-04-06 DIAGNOSIS — M791 Myalgia: Secondary | ICD-10-CM | POA: Diagnosis not present

## 2016-04-08 DIAGNOSIS — M545 Low back pain: Secondary | ICD-10-CM | POA: Diagnosis not present

## 2016-04-08 DIAGNOSIS — M791 Myalgia: Secondary | ICD-10-CM | POA: Diagnosis not present

## 2016-04-08 DIAGNOSIS — M5137 Other intervertebral disc degeneration, lumbosacral region: Secondary | ICD-10-CM | POA: Diagnosis not present

## 2016-04-08 DIAGNOSIS — M461 Sacroiliitis, not elsewhere classified: Secondary | ICD-10-CM | POA: Diagnosis not present

## 2016-04-08 DIAGNOSIS — M5127 Other intervertebral disc displacement, lumbosacral region: Secondary | ICD-10-CM | POA: Diagnosis not present

## 2016-04-11 DIAGNOSIS — M5127 Other intervertebral disc displacement, lumbosacral region: Secondary | ICD-10-CM | POA: Diagnosis not present

## 2016-04-11 DIAGNOSIS — M5137 Other intervertebral disc degeneration, lumbosacral region: Secondary | ICD-10-CM | POA: Diagnosis not present

## 2016-04-11 DIAGNOSIS — M545 Low back pain: Secondary | ICD-10-CM | POA: Diagnosis not present

## 2016-04-11 DIAGNOSIS — M461 Sacroiliitis, not elsewhere classified: Secondary | ICD-10-CM | POA: Diagnosis not present

## 2016-04-11 DIAGNOSIS — M791 Myalgia: Secondary | ICD-10-CM | POA: Diagnosis not present

## 2016-04-13 DIAGNOSIS — M2351 Chronic instability of knee, right knee: Secondary | ICD-10-CM | POA: Diagnosis not present

## 2016-04-13 DIAGNOSIS — M5127 Other intervertebral disc displacement, lumbosacral region: Secondary | ICD-10-CM | POA: Diagnosis not present

## 2016-04-13 DIAGNOSIS — M791 Myalgia: Secondary | ICD-10-CM | POA: Diagnosis not present

## 2016-04-13 DIAGNOSIS — M545 Low back pain: Secondary | ICD-10-CM | POA: Diagnosis not present

## 2016-04-13 DIAGNOSIS — M1711 Unilateral primary osteoarthritis, right knee: Secondary | ICD-10-CM | POA: Diagnosis not present

## 2016-04-13 DIAGNOSIS — M5137 Other intervertebral disc degeneration, lumbosacral region: Secondary | ICD-10-CM | POA: Diagnosis not present

## 2016-04-13 DIAGNOSIS — M461 Sacroiliitis, not elsewhere classified: Secondary | ICD-10-CM | POA: Diagnosis not present

## 2016-04-15 DIAGNOSIS — M5137 Other intervertebral disc degeneration, lumbosacral region: Secondary | ICD-10-CM | POA: Diagnosis not present

## 2016-04-15 DIAGNOSIS — M2352 Chronic instability of knee, left knee: Secondary | ICD-10-CM | POA: Diagnosis not present

## 2016-04-15 DIAGNOSIS — M1712 Unilateral primary osteoarthritis, left knee: Secondary | ICD-10-CM | POA: Diagnosis not present

## 2016-04-15 DIAGNOSIS — M791 Myalgia: Secondary | ICD-10-CM | POA: Diagnosis not present

## 2016-04-15 DIAGNOSIS — M461 Sacroiliitis, not elsewhere classified: Secondary | ICD-10-CM | POA: Diagnosis not present

## 2016-04-15 DIAGNOSIS — M5127 Other intervertebral disc displacement, lumbosacral region: Secondary | ICD-10-CM | POA: Diagnosis not present

## 2016-04-15 DIAGNOSIS — M545 Low back pain: Secondary | ICD-10-CM | POA: Diagnosis not present

## 2016-04-18 DIAGNOSIS — R202 Paresthesia of skin: Secondary | ICD-10-CM | POA: Diagnosis not present

## 2016-04-18 DIAGNOSIS — M4726 Other spondylosis with radiculopathy, lumbar region: Secondary | ICD-10-CM | POA: Diagnosis not present

## 2016-04-20 DIAGNOSIS — M5137 Other intervertebral disc degeneration, lumbosacral region: Secondary | ICD-10-CM | POA: Diagnosis not present

## 2016-04-20 DIAGNOSIS — M791 Myalgia: Secondary | ICD-10-CM | POA: Diagnosis not present

## 2016-04-20 DIAGNOSIS — M5127 Other intervertebral disc displacement, lumbosacral region: Secondary | ICD-10-CM | POA: Diagnosis not present

## 2016-04-20 DIAGNOSIS — M461 Sacroiliitis, not elsewhere classified: Secondary | ICD-10-CM | POA: Diagnosis not present

## 2016-04-22 DIAGNOSIS — M545 Low back pain: Secondary | ICD-10-CM | POA: Diagnosis not present

## 2016-04-22 DIAGNOSIS — M461 Sacroiliitis, not elsewhere classified: Secondary | ICD-10-CM | POA: Diagnosis not present

## 2016-04-22 DIAGNOSIS — M5127 Other intervertebral disc displacement, lumbosacral region: Secondary | ICD-10-CM | POA: Diagnosis not present

## 2016-04-22 DIAGNOSIS — M5137 Other intervertebral disc degeneration, lumbosacral region: Secondary | ICD-10-CM | POA: Diagnosis not present

## 2016-04-22 DIAGNOSIS — M791 Myalgia: Secondary | ICD-10-CM | POA: Diagnosis not present

## 2016-04-25 DIAGNOSIS — M461 Sacroiliitis, not elsewhere classified: Secondary | ICD-10-CM | POA: Diagnosis not present

## 2016-04-25 DIAGNOSIS — M791 Myalgia: Secondary | ICD-10-CM | POA: Diagnosis not present

## 2016-04-25 DIAGNOSIS — M5127 Other intervertebral disc displacement, lumbosacral region: Secondary | ICD-10-CM | POA: Diagnosis not present

## 2016-04-25 DIAGNOSIS — M5137 Other intervertebral disc degeneration, lumbosacral region: Secondary | ICD-10-CM | POA: Diagnosis not present

## 2016-04-25 DIAGNOSIS — M545 Low back pain: Secondary | ICD-10-CM | POA: Diagnosis not present

## 2016-04-27 DIAGNOSIS — M5137 Other intervertebral disc degeneration, lumbosacral region: Secondary | ICD-10-CM | POA: Diagnosis not present

## 2016-04-27 DIAGNOSIS — M791 Myalgia: Secondary | ICD-10-CM | POA: Diagnosis not present

## 2016-04-27 DIAGNOSIS — M461 Sacroiliitis, not elsewhere classified: Secondary | ICD-10-CM | POA: Diagnosis not present

## 2016-04-27 DIAGNOSIS — M5127 Other intervertebral disc displacement, lumbosacral region: Secondary | ICD-10-CM | POA: Diagnosis not present

## 2016-04-29 DIAGNOSIS — M461 Sacroiliitis, not elsewhere classified: Secondary | ICD-10-CM | POA: Diagnosis not present

## 2016-04-29 DIAGNOSIS — M545 Low back pain: Secondary | ICD-10-CM | POA: Diagnosis not present

## 2016-04-29 DIAGNOSIS — M5137 Other intervertebral disc degeneration, lumbosacral region: Secondary | ICD-10-CM | POA: Diagnosis not present

## 2016-04-29 DIAGNOSIS — M5127 Other intervertebral disc displacement, lumbosacral region: Secondary | ICD-10-CM | POA: Diagnosis not present

## 2016-04-29 DIAGNOSIS — M791 Myalgia: Secondary | ICD-10-CM | POA: Diagnosis not present

## 2016-05-02 DIAGNOSIS — M5127 Other intervertebral disc displacement, lumbosacral region: Secondary | ICD-10-CM | POA: Diagnosis not present

## 2016-05-02 DIAGNOSIS — M461 Sacroiliitis, not elsewhere classified: Secondary | ICD-10-CM | POA: Diagnosis not present

## 2016-05-02 DIAGNOSIS — M791 Myalgia: Secondary | ICD-10-CM | POA: Diagnosis not present

## 2016-05-02 DIAGNOSIS — M5137 Other intervertebral disc degeneration, lumbosacral region: Secondary | ICD-10-CM | POA: Diagnosis not present

## 2016-05-02 DIAGNOSIS — M545 Low back pain: Secondary | ICD-10-CM | POA: Diagnosis not present

## 2016-05-04 DIAGNOSIS — M5137 Other intervertebral disc degeneration, lumbosacral region: Secondary | ICD-10-CM | POA: Diagnosis not present

## 2016-05-04 DIAGNOSIS — M791 Myalgia: Secondary | ICD-10-CM | POA: Diagnosis not present

## 2016-05-04 DIAGNOSIS — M461 Sacroiliitis, not elsewhere classified: Secondary | ICD-10-CM | POA: Diagnosis not present

## 2016-05-04 DIAGNOSIS — M5127 Other intervertebral disc displacement, lumbosacral region: Secondary | ICD-10-CM | POA: Diagnosis not present

## 2016-05-06 DIAGNOSIS — M542 Cervicalgia: Secondary | ICD-10-CM | POA: Diagnosis not present

## 2016-05-06 DIAGNOSIS — M4722 Other spondylosis with radiculopathy, cervical region: Secondary | ICD-10-CM | POA: Diagnosis not present

## 2016-05-06 DIAGNOSIS — M461 Sacroiliitis, not elsewhere classified: Secondary | ICD-10-CM | POA: Diagnosis not present

## 2016-05-06 DIAGNOSIS — M791 Myalgia: Secondary | ICD-10-CM | POA: Diagnosis not present

## 2016-05-06 DIAGNOSIS — M50221 Other cervical disc displacement at C4-C5 level: Secondary | ICD-10-CM | POA: Diagnosis not present

## 2016-05-09 DIAGNOSIS — M791 Myalgia: Secondary | ICD-10-CM | POA: Diagnosis not present

## 2016-05-09 DIAGNOSIS — M542 Cervicalgia: Secondary | ICD-10-CM | POA: Diagnosis not present

## 2016-05-09 DIAGNOSIS — M4722 Other spondylosis with radiculopathy, cervical region: Secondary | ICD-10-CM | POA: Diagnosis not present

## 2016-05-09 DIAGNOSIS — M461 Sacroiliitis, not elsewhere classified: Secondary | ICD-10-CM | POA: Diagnosis not present

## 2016-05-09 DIAGNOSIS — M50221 Other cervical disc displacement at C4-C5 level: Secondary | ICD-10-CM | POA: Diagnosis not present

## 2016-05-11 DIAGNOSIS — M50221 Other cervical disc displacement at C4-C5 level: Secondary | ICD-10-CM | POA: Diagnosis not present

## 2016-05-11 DIAGNOSIS — M791 Myalgia: Secondary | ICD-10-CM | POA: Diagnosis not present

## 2016-05-11 DIAGNOSIS — M461 Sacroiliitis, not elsewhere classified: Secondary | ICD-10-CM | POA: Diagnosis not present

## 2016-05-11 DIAGNOSIS — M4722 Other spondylosis with radiculopathy, cervical region: Secondary | ICD-10-CM | POA: Diagnosis not present

## 2016-05-12 DIAGNOSIS — E663 Overweight: Secondary | ICD-10-CM | POA: Diagnosis not present

## 2016-05-12 DIAGNOSIS — Z713 Dietary counseling and surveillance: Secondary | ICD-10-CM | POA: Diagnosis not present

## 2016-05-12 DIAGNOSIS — J449 Chronic obstructive pulmonary disease, unspecified: Secondary | ICD-10-CM | POA: Diagnosis not present

## 2016-05-13 DIAGNOSIS — M50221 Other cervical disc displacement at C4-C5 level: Secondary | ICD-10-CM | POA: Diagnosis not present

## 2016-05-13 DIAGNOSIS — M542 Cervicalgia: Secondary | ICD-10-CM | POA: Diagnosis not present

## 2016-05-13 DIAGNOSIS — M791 Myalgia: Secondary | ICD-10-CM | POA: Diagnosis not present

## 2016-05-13 DIAGNOSIS — M4722 Other spondylosis with radiculopathy, cervical region: Secondary | ICD-10-CM | POA: Diagnosis not present

## 2016-05-13 DIAGNOSIS — M461 Sacroiliitis, not elsewhere classified: Secondary | ICD-10-CM | POA: Diagnosis not present

## 2016-05-16 DIAGNOSIS — M791 Myalgia: Secondary | ICD-10-CM | POA: Diagnosis not present

## 2016-05-16 DIAGNOSIS — M542 Cervicalgia: Secondary | ICD-10-CM | POA: Diagnosis not present

## 2016-05-16 DIAGNOSIS — M461 Sacroiliitis, not elsewhere classified: Secondary | ICD-10-CM | POA: Diagnosis not present

## 2016-05-16 DIAGNOSIS — M50221 Other cervical disc displacement at C4-C5 level: Secondary | ICD-10-CM | POA: Diagnosis not present

## 2016-05-16 DIAGNOSIS — M4722 Other spondylosis with radiculopathy, cervical region: Secondary | ICD-10-CM | POA: Diagnosis not present

## 2016-05-18 DIAGNOSIS — M4722 Other spondylosis with radiculopathy, cervical region: Secondary | ICD-10-CM | POA: Diagnosis not present

## 2016-05-18 DIAGNOSIS — M791 Myalgia: Secondary | ICD-10-CM | POA: Diagnosis not present

## 2016-05-18 DIAGNOSIS — M50221 Other cervical disc displacement at C4-C5 level: Secondary | ICD-10-CM | POA: Diagnosis not present

## 2016-05-18 DIAGNOSIS — M461 Sacroiliitis, not elsewhere classified: Secondary | ICD-10-CM | POA: Diagnosis not present

## 2016-05-20 DIAGNOSIS — M791 Myalgia: Secondary | ICD-10-CM | POA: Diagnosis not present

## 2016-05-20 DIAGNOSIS — M542 Cervicalgia: Secondary | ICD-10-CM | POA: Diagnosis not present

## 2016-05-20 DIAGNOSIS — M50221 Other cervical disc displacement at C4-C5 level: Secondary | ICD-10-CM | POA: Diagnosis not present

## 2016-05-20 DIAGNOSIS — M4722 Other spondylosis with radiculopathy, cervical region: Secondary | ICD-10-CM | POA: Diagnosis not present

## 2016-05-20 DIAGNOSIS — M461 Sacroiliitis, not elsewhere classified: Secondary | ICD-10-CM | POA: Diagnosis not present

## 2016-05-23 DIAGNOSIS — M4722 Other spondylosis with radiculopathy, cervical region: Secondary | ICD-10-CM | POA: Diagnosis not present

## 2016-05-23 DIAGNOSIS — M50221 Other cervical disc displacement at C4-C5 level: Secondary | ICD-10-CM | POA: Diagnosis not present

## 2016-05-23 DIAGNOSIS — M542 Cervicalgia: Secondary | ICD-10-CM | POA: Diagnosis not present

## 2016-05-23 DIAGNOSIS — M791 Myalgia: Secondary | ICD-10-CM | POA: Diagnosis not present

## 2016-05-23 DIAGNOSIS — M461 Sacroiliitis, not elsewhere classified: Secondary | ICD-10-CM | POA: Diagnosis not present

## 2016-05-27 DIAGNOSIS — M542 Cervicalgia: Secondary | ICD-10-CM | POA: Diagnosis not present

## 2016-05-27 DIAGNOSIS — M50221 Other cervical disc displacement at C4-C5 level: Secondary | ICD-10-CM | POA: Diagnosis not present

## 2016-05-27 DIAGNOSIS — M4722 Other spondylosis with radiculopathy, cervical region: Secondary | ICD-10-CM | POA: Diagnosis not present

## 2016-05-27 DIAGNOSIS — M461 Sacroiliitis, not elsewhere classified: Secondary | ICD-10-CM | POA: Diagnosis not present

## 2016-05-27 DIAGNOSIS — M791 Myalgia: Secondary | ICD-10-CM | POA: Diagnosis not present

## 2016-05-30 DIAGNOSIS — M542 Cervicalgia: Secondary | ICD-10-CM | POA: Diagnosis not present

## 2016-05-30 DIAGNOSIS — M4722 Other spondylosis with radiculopathy, cervical region: Secondary | ICD-10-CM | POA: Diagnosis not present

## 2016-05-30 DIAGNOSIS — M461 Sacroiliitis, not elsewhere classified: Secondary | ICD-10-CM | POA: Diagnosis not present

## 2016-05-30 DIAGNOSIS — M50221 Other cervical disc displacement at C4-C5 level: Secondary | ICD-10-CM | POA: Diagnosis not present

## 2016-05-30 DIAGNOSIS — M791 Myalgia: Secondary | ICD-10-CM | POA: Diagnosis not present

## 2016-06-01 DIAGNOSIS — M50221 Other cervical disc displacement at C4-C5 level: Secondary | ICD-10-CM | POA: Diagnosis not present

## 2016-06-01 DIAGNOSIS — M461 Sacroiliitis, not elsewhere classified: Secondary | ICD-10-CM | POA: Diagnosis not present

## 2016-06-01 DIAGNOSIS — M791 Myalgia: Secondary | ICD-10-CM | POA: Diagnosis not present

## 2016-06-01 DIAGNOSIS — M4722 Other spondylosis with radiculopathy, cervical region: Secondary | ICD-10-CM | POA: Diagnosis not present

## 2016-06-03 DIAGNOSIS — M791 Myalgia: Secondary | ICD-10-CM | POA: Diagnosis not present

## 2016-06-03 DIAGNOSIS — M4722 Other spondylosis with radiculopathy, cervical region: Secondary | ICD-10-CM | POA: Diagnosis not present

## 2016-06-03 DIAGNOSIS — M50221 Other cervical disc displacement at C4-C5 level: Secondary | ICD-10-CM | POA: Diagnosis not present

## 2016-06-03 DIAGNOSIS — M461 Sacroiliitis, not elsewhere classified: Secondary | ICD-10-CM | POA: Diagnosis not present

## 2016-06-13 DIAGNOSIS — M4722 Other spondylosis with radiculopathy, cervical region: Secondary | ICD-10-CM | POA: Diagnosis not present

## 2016-06-13 DIAGNOSIS — R202 Paresthesia of skin: Secondary | ICD-10-CM | POA: Diagnosis not present

## 2016-07-28 DIAGNOSIS — M79671 Pain in right foot: Secondary | ICD-10-CM | POA: Diagnosis not present

## 2016-08-29 DIAGNOSIS — M722 Plantar fascial fibromatosis: Secondary | ICD-10-CM | POA: Diagnosis not present

## 2016-09-06 DIAGNOSIS — E890 Postprocedural hypothyroidism: Secondary | ICD-10-CM | POA: Diagnosis not present

## 2016-09-12 DIAGNOSIS — Z85850 Personal history of malignant neoplasm of thyroid: Secondary | ICD-10-CM | POA: Diagnosis not present

## 2016-09-12 DIAGNOSIS — E890 Postprocedural hypothyroidism: Secondary | ICD-10-CM | POA: Diagnosis not present

## 2016-11-14 DIAGNOSIS — R739 Hyperglycemia, unspecified: Secondary | ICD-10-CM | POA: Diagnosis not present

## 2016-11-15 DIAGNOSIS — Z23 Encounter for immunization: Secondary | ICD-10-CM | POA: Diagnosis not present

## 2016-11-15 DIAGNOSIS — J449 Chronic obstructive pulmonary disease, unspecified: Secondary | ICD-10-CM | POA: Diagnosis not present

## 2016-11-15 DIAGNOSIS — E669 Obesity, unspecified: Secondary | ICD-10-CM | POA: Diagnosis not present

## 2016-11-15 DIAGNOSIS — E890 Postprocedural hypothyroidism: Secondary | ICD-10-CM | POA: Diagnosis not present

## 2016-11-15 DIAGNOSIS — Z87891 Personal history of nicotine dependence: Secondary | ICD-10-CM | POA: Diagnosis not present

## 2016-11-15 DIAGNOSIS — Z0000 Encounter for general adult medical examination without abnormal findings: Secondary | ICD-10-CM | POA: Diagnosis not present

## 2016-11-15 DIAGNOSIS — R300 Dysuria: Secondary | ICD-10-CM | POA: Diagnosis not present

## 2016-11-15 DIAGNOSIS — E119 Type 2 diabetes mellitus without complications: Secondary | ICD-10-CM | POA: Diagnosis not present

## 2016-12-23 DIAGNOSIS — D3500 Benign neoplasm of unspecified adrenal gland: Secondary | ICD-10-CM

## 2016-12-23 HISTORY — DX: Benign neoplasm of unspecified adrenal gland: D35.00

## 2017-01-05 ENCOUNTER — Other Ambulatory Visit: Payer: Self-pay | Admitting: Gastroenterology

## 2017-01-05 DIAGNOSIS — R933 Abnormal findings on diagnostic imaging of other parts of digestive tract: Secondary | ICD-10-CM | POA: Diagnosis not present

## 2017-01-05 DIAGNOSIS — R110 Nausea: Secondary | ICD-10-CM | POA: Diagnosis not present

## 2017-01-05 DIAGNOSIS — K862 Cyst of pancreas: Secondary | ICD-10-CM | POA: Diagnosis not present

## 2017-01-09 ENCOUNTER — Ambulatory Visit
Admission: RE | Admit: 2017-01-09 | Discharge: 2017-01-09 | Disposition: A | Discharge status: Home / Self Care | Payer: BLUE CROSS/BLUE SHIELD | Source: Ambulatory Visit | Attending: Gastroenterology | Admitting: Gastroenterology

## 2017-01-09 DIAGNOSIS — K862 Cyst of pancreas: Secondary | ICD-10-CM

## 2017-01-09 MED ORDER — IOPAMIDOL (ISOVUE-300) INJECTION 61%
100.0000 mL | Freq: Once | INTRAVENOUS | Status: AC | PRN
  Administered 2017-01-09: 100 mL via INTRAVENOUS

## 2017-03-13 DIAGNOSIS — E890 Postprocedural hypothyroidism: Secondary | ICD-10-CM | POA: Diagnosis not present

## 2017-03-16 DIAGNOSIS — Z85850 Personal history of malignant neoplasm of thyroid: Secondary | ICD-10-CM | POA: Diagnosis not present

## 2017-03-16 DIAGNOSIS — E890 Postprocedural hypothyroidism: Secondary | ICD-10-CM | POA: Diagnosis not present

## 2017-03-16 DIAGNOSIS — Z8349 Family history of other endocrine, nutritional and metabolic diseases: Secondary | ICD-10-CM | POA: Diagnosis not present

## 2017-03-20 ENCOUNTER — Other Ambulatory Visit: Payer: Self-pay | Admitting: Family Medicine

## 2017-03-20 DIAGNOSIS — Z1231 Encounter for screening mammogram for malignant neoplasm of breast: Secondary | ICD-10-CM

## 2017-03-21 ENCOUNTER — Ambulatory Visit
Admission: RE | Admit: 2017-03-21 | Discharge: 2017-03-21 | Disposition: A | Discharge status: Home / Self Care | Payer: BLUE CROSS/BLUE SHIELD | Source: Ambulatory Visit | Attending: Family Medicine | Admitting: Family Medicine

## 2017-03-21 DIAGNOSIS — Z1231 Encounter for screening mammogram for malignant neoplasm of breast: Secondary | ICD-10-CM

## 2017-05-19 DIAGNOSIS — N951 Menopausal and female climacteric states: Secondary | ICD-10-CM | POA: Diagnosis not present

## 2017-05-19 DIAGNOSIS — E119 Type 2 diabetes mellitus without complications: Secondary | ICD-10-CM | POA: Diagnosis not present

## 2017-05-19 DIAGNOSIS — J449 Chronic obstructive pulmonary disease, unspecified: Secondary | ICD-10-CM | POA: Diagnosis not present

## 2017-06-26 ENCOUNTER — Encounter: Payer: Self-pay | Admitting: Registered"

## 2017-06-26 ENCOUNTER — Encounter: Payer: BLUE CROSS/BLUE SHIELD | Attending: Family Medicine | Admitting: Registered"

## 2017-06-26 DIAGNOSIS — Z713 Dietary counseling and surveillance: Secondary | ICD-10-CM | POA: Insufficient documentation

## 2017-06-26 DIAGNOSIS — E118 Type 2 diabetes mellitus with unspecified complications: Secondary | ICD-10-CM

## 2017-06-26 DIAGNOSIS — E119 Type 2 diabetes mellitus without complications: Secondary | ICD-10-CM | POA: Insufficient documentation

## 2017-06-26 DIAGNOSIS — E669 Obesity, unspecified: Secondary | ICD-10-CM | POA: Insufficient documentation

## 2017-06-26 NOTE — Progress Notes (Signed)
Diabetes Self-Management Education  Visit Type: First/Initial  Appt. Start Time: 1615 Appt. End Time: 8469  06/27/2017  Ms. Kimberly Barnett, identified by name and date of birth, is a 61 y.o. female with a diagnosis of Diabetes: Type 2.   ASSESSMENT Patient states her knowledge for management of blood sugar is just to avoid white food and sugar. Patient states she wants to understand which are "good" and "bad" carbs. Patient also states she would like to understand A1c and diagnosis of diabetes. There was not sufficient time during this visit to cover the diagnosis topic in depth.  Patient states barrier to eating more vegetables is her spouse's food preferences limited to meat and potatoes as well as different schedule for eating meals. Patient reports having a garden and also preserves the food she grows.  Patient states one of her doctors advised that she limit milk intake due to her thyroid medication. Patient reports takes Synthroid because she no longer has a thyroid due to cancer.  Patient states she has chronic issue with sleep and pain and uses CBD oil and marijuana to help relieve pain and get to sleep. Patient reports she is having difficulty with smoking cessation due to stress. Patient states she has chronic constipation and dependent on products such as Miralax.   Patient states she believes her vision has become more blurry, however financial concerns have kept her from having an eye exam in the last year.  Diabetes Self-Management Education - 06/26/17 1612      Visit Information   Visit Type  First/Initial      Initial Visit   Diabetes Type  Type 2    Are you currently following a meal plan?  Yes    What type of meal plan do you follow?  tries to stay away from carbs, cut out sugar    Are you taking your medications as prescribed?  Not on Medications    Date Diagnosed  2018      Health Coping   How would you rate your overall health?  Fair      Psychosocial Assessment    Patient Belief/Attitude about Diabetes  Motivated to manage diabetes    How often do you need to have someone help you when you read instructions, pamphlets, or other written materials from your doctor or pharmacy?  3 - Sometimes    What is the last grade level you completed in school?  62XB      Complications   Last HgB A1C per patient/outside source  6.4 % per patient    How often do you check your blood sugar?  0 times/day (not testing)    Have you had a dilated eye exam in the past 12 months?  No    Have you had a dental exam in the past 12 months?  No    Are you checking your feet?  Yes    How many days per week are you checking your feet?  4      Dietary Intake   Breakfast  premier protein & 2 boiled eggs or cottage cheese and blue berries OR none    Snack (morning)  pork skins OR nut nutrition bars OR glucerna    Lunch  bacon, eggs, toast OR french toast, maple syrup, sausage    Snack (afternoon)  pistachios OR slim jim OR Smart Popcorn OR ice cream    Dinner  deer meat or chicken, broccoli, cauliflower, occassionally rice (doesn't like brown) OR occassionally pasta  1/2 - 1 c    Snack (evening)  none (nothing after 8 pm)    Beverage(s)  water, milk, (stopped drinking OJ), 8 oz regular sprite 2x month      Exercise   Exercise Type  Light (walking / raking leaves)    How many days per week to you exercise?  4    How many minutes per day do you exercise?  35    Total minutes per week of exercise  140      Patient Education   Previous Diabetes Education  No    Nutrition management   Role of diet in the treatment of diabetes and the relationship between the three main macronutrients and blood glucose level;Carbohydrate counting;Food label reading, portion sizes and measuring food.    Monitoring  Identified appropriate SMBG and/or A1C goals.    Chronic complications  Relationship between chronic complications and blood glucose control      Individualized Goals (developed by  patient)   Nutrition  General guidelines for healthy choices and portions discussed    Reducing Risk  increase portions of nuts and seeds pt states she is working on smoking cessation      Outcomes   Expected Outcomes  Demonstrated interest in learning. Expect positive outcomes    Future DMSE  PRN    Program Status  Completed     Individualized Plan for Diabetes Self-Management Training:   Learning Objective:  Patient will have a greater understanding of diabetes self-management. Patient education plan is to attend individual and/or group sessions per assessed needs and concerns.   Patient Instructions  Plan:  Aim for 3-4 Carb Choices per meal (45-60 grams)   Aim for 0-1 Carbs per snack if hungry  Include protein in moderation with your meals and snacks The Premier protein not necessary with a breakfast that has other protein from other foods such as eggs. You can use the Premier Protein with cereal to help balance the carbs in the cereal with the protein in the drink. Consider reading food labels for Total Carbohydrate and Fat Grams of foods Consider daily exercise as tolerated Read through sleep tips for ideas for better sleep  For Constipation: Consider including nuts daily for a good source of fiber and protein. Flaxseed in a smoothie may help with constipation. Magnesium at bedtime may help with sleep as well as constipation, however magnesium can impair the absorption of thyroid medications, so please space out magnesium by 4 hours from your thyroid medications. Water and exercise also help to promote regularity  Expected Outcomes:  Demonstrated interest in learning. Expect positive outcomes  Education material provided: Living Well with Diabetes, A1C conversion sheet, Snack sheet and Carbohydrate counting sheet, Sleep Hygiene  If problems or questions, patient to contact team via:  Phone  Future DSME appointment: PRN

## 2017-06-26 NOTE — Patient Instructions (Addendum)
Plan:  Aim for 3-4 Carb Choices per meal (45-60 grams)   Aim for 0-1 Carbs per snack if hungry  Include protein in moderation with your meals and snacks The Premier protein not necessary with a breakfast that has other protein from other foods such as eggs. You can use the Premier Protein with cereal to help balance the carbs in the cereal with the protein in the drink. Consider reading food labels for Total Carbohydrate and Fat Grams of foods Consider daily exercise as tolerated Read through sleep tips for ideas for better sleep  For Constipation: Consider including nuts daily for a good source of fiber and protein. Flaxseed in a smoothie may help with constipation. Magnesium at bedtime may help with sleep as well as constipation, however magnesium can impair the absorption of thyroid medications, so please space out magnesium by 4 hours from your thyroid medications. Water and exercise also help to promote regularity

## 2017-07-25 DIAGNOSIS — K862 Cyst of pancreas: Secondary | ICD-10-CM

## 2017-07-25 HISTORY — DX: Cyst of pancreas: K86.2

## 2017-09-18 DIAGNOSIS — Z85850 Personal history of malignant neoplasm of thyroid: Secondary | ICD-10-CM | POA: Diagnosis not present

## 2017-09-21 DIAGNOSIS — Z85850 Personal history of malignant neoplasm of thyroid: Secondary | ICD-10-CM | POA: Diagnosis not present

## 2017-09-21 DIAGNOSIS — Z79890 Hormone replacement therapy (postmenopausal): Secondary | ICD-10-CM | POA: Diagnosis not present

## 2017-09-21 DIAGNOSIS — E890 Postprocedural hypothyroidism: Secondary | ICD-10-CM | POA: Diagnosis not present

## 2017-11-24 DIAGNOSIS — J449 Chronic obstructive pulmonary disease, unspecified: Secondary | ICD-10-CM | POA: Diagnosis not present

## 2017-11-24 DIAGNOSIS — Z87891 Personal history of nicotine dependence: Secondary | ICD-10-CM | POA: Diagnosis not present

## 2017-11-24 DIAGNOSIS — R739 Hyperglycemia, unspecified: Secondary | ICD-10-CM | POA: Diagnosis not present

## 2017-11-24 DIAGNOSIS — N951 Menopausal and female climacteric states: Secondary | ICD-10-CM | POA: Diagnosis not present

## 2017-11-24 DIAGNOSIS — Z0000 Encounter for general adult medical examination without abnormal findings: Secondary | ICD-10-CM | POA: Diagnosis not present

## 2018-02-01 ENCOUNTER — Other Ambulatory Visit: Payer: Self-pay | Admitting: Gastroenterology

## 2018-02-01 DIAGNOSIS — K862 Cyst of pancreas: Secondary | ICD-10-CM

## 2018-02-27 DIAGNOSIS — E119 Type 2 diabetes mellitus without complications: Secondary | ICD-10-CM | POA: Diagnosis not present

## 2018-03-01 ENCOUNTER — Ambulatory Visit
Admission: RE | Admit: 2018-03-01 | Discharge: 2018-03-01 | Disposition: A | Discharge status: Home / Self Care | Payer: BLUE CROSS/BLUE SHIELD | Source: Ambulatory Visit | Attending: Gastroenterology | Admitting: Gastroenterology

## 2018-03-01 DIAGNOSIS — K862 Cyst of pancreas: Secondary | ICD-10-CM | POA: Diagnosis not present

## 2018-03-01 MED ORDER — IOPAMIDOL (ISOVUE-300) INJECTION 61%
100.0000 mL | Freq: Once | INTRAVENOUS | Status: AC | PRN
  Administered 2018-03-01: 100 mL via INTRAVENOUS

## 2018-03-29 DIAGNOSIS — E890 Postprocedural hypothyroidism: Secondary | ICD-10-CM | POA: Diagnosis not present

## 2018-04-02 DIAGNOSIS — Z85850 Personal history of malignant neoplasm of thyroid: Secondary | ICD-10-CM | POA: Diagnosis not present

## 2018-04-02 DIAGNOSIS — E890 Postprocedural hypothyroidism: Secondary | ICD-10-CM | POA: Diagnosis not present

## 2018-06-04 DIAGNOSIS — E1169 Type 2 diabetes mellitus with other specified complication: Secondary | ICD-10-CM | POA: Diagnosis not present

## 2018-06-04 DIAGNOSIS — E7800 Pure hypercholesterolemia, unspecified: Secondary | ICD-10-CM | POA: Diagnosis not present

## 2018-09-13 ENCOUNTER — Other Ambulatory Visit: Payer: Self-pay | Admitting: Gastroenterology

## 2018-09-13 DIAGNOSIS — K862 Cyst of pancreas: Secondary | ICD-10-CM

## 2018-09-24 DIAGNOSIS — E890 Postprocedural hypothyroidism: Secondary | ICD-10-CM | POA: Diagnosis not present

## 2018-09-24 DIAGNOSIS — Z85850 Personal history of malignant neoplasm of thyroid: Secondary | ICD-10-CM | POA: Diagnosis not present

## 2018-09-27 ENCOUNTER — Other Ambulatory Visit: Payer: Self-pay | Admitting: Gastroenterology

## 2018-09-28 ENCOUNTER — Ambulatory Visit
Admission: RE | Admit: 2018-09-28 | Discharge: 2018-09-28 | Disposition: A | Discharge status: Home / Self Care | Payer: BLUE CROSS/BLUE SHIELD | Source: Ambulatory Visit | Attending: Gastroenterology | Admitting: Gastroenterology

## 2018-09-28 DIAGNOSIS — K862 Cyst of pancreas: Secondary | ICD-10-CM

## 2018-09-28 MED ORDER — GADOBENATE DIMEGLUMINE 529 MG/ML IV SOLN
15.0000 mL | Freq: Once | INTRAVENOUS | Status: AC | PRN
  Administered 2018-09-28: 15 mL via INTRAVENOUS

## 2018-10-01 DIAGNOSIS — Z85850 Personal history of malignant neoplasm of thyroid: Secondary | ICD-10-CM | POA: Diagnosis not present

## 2018-10-01 DIAGNOSIS — E890 Postprocedural hypothyroidism: Secondary | ICD-10-CM | POA: Diagnosis not present

## 2018-10-05 ENCOUNTER — Other Ambulatory Visit: Payer: Self-pay | Admitting: Gastroenterology

## 2018-10-24 ENCOUNTER — Ambulatory Visit (HOSPITAL_COMMUNITY)
Admission: RE | Admit: 2018-10-24 | Payer: BLUE CROSS/BLUE SHIELD | Source: Home / Self Care | Admitting: Gastroenterology

## 2018-10-24 ENCOUNTER — Encounter (HOSPITAL_COMMUNITY): Admission: RE | Payer: Self-pay | Source: Home / Self Care

## 2018-10-24 SURGERY — ULTRASOUND, UPPER GI TRACT, ENDOSCOPIC
Anesthesia: Monitor Anesthesia Care

## 2018-12-04 DIAGNOSIS — M722 Plantar fascial fibromatosis: Secondary | ICD-10-CM | POA: Diagnosis not present

## 2018-12-04 DIAGNOSIS — E1169 Type 2 diabetes mellitus with other specified complication: Secondary | ICD-10-CM | POA: Diagnosis not present

## 2018-12-04 DIAGNOSIS — E890 Postprocedural hypothyroidism: Secondary | ICD-10-CM | POA: Diagnosis not present

## 2018-12-04 DIAGNOSIS — J449 Chronic obstructive pulmonary disease, unspecified: Secondary | ICD-10-CM | POA: Diagnosis not present

## 2019-01-03 ENCOUNTER — Other Ambulatory Visit: Payer: Self-pay | Admitting: Gastroenterology

## 2019-01-19 ENCOUNTER — Other Ambulatory Visit (HOSPITAL_COMMUNITY)
Admission: RE | Admit: 2019-01-19 | Discharge: 2019-01-19 | Disposition: A | Discharge status: Home / Self Care | Payer: BC Managed Care – PPO | Source: Ambulatory Visit | Attending: Gastroenterology | Admitting: Gastroenterology

## 2019-01-19 DIAGNOSIS — Z1159 Encounter for screening for other viral diseases: Secondary | ICD-10-CM | POA: Insufficient documentation

## 2019-01-19 LAB — SARS CORONAVIRUS 2 (TAT 6-24 HRS): SARS Coronavirus 2: NEGATIVE

## 2019-01-22 ENCOUNTER — Encounter (HOSPITAL_COMMUNITY): Payer: Self-pay

## 2019-01-22 ENCOUNTER — Other Ambulatory Visit: Payer: Self-pay

## 2019-01-22 ENCOUNTER — Other Ambulatory Visit: Payer: Self-pay | Admitting: Gastroenterology

## 2019-01-22 NOTE — Progress Notes (Signed)
SPOKE W/  Kimberly Barnett     SCREENING SYMPTOMS OF COVID 19:   COUGH--NO  RUNNY NOSE--- NO  SORE THROAT---NO  NASAL CONGESTION----NO  SNEEZING----NO  SHORTNESS OF BREATH---NO  DIFFICULTY BREATHING---NO  TEMP >100.0 -----NO  UNEXPLAINED BODY ACHES------No  CHILLS -------- NO  HEADACHES ---------NO  LOSS OF SMELL/ TASTE --------NO    HAVE YOU OR ANY FAMILY MEMBER TRAVELLED PAST 14 DAYS OUT OF THE   COUNTY---NO STATE----NO COUNTRY----NO  HAVE YOU OR ANY FAMILY MEMBER BEEN EXPOSED TO ANYONE WITH COVID 19? NO

## 2019-01-23 ENCOUNTER — Ambulatory Visit (HOSPITAL_COMMUNITY): Payer: BC Managed Care – PPO | Admitting: Certified Registered"

## 2019-01-23 ENCOUNTER — Encounter (HOSPITAL_COMMUNITY)
Admission: RE | Disposition: A | Discharge status: Home / Self Care | Payer: Self-pay | Source: Home / Self Care | Attending: Gastroenterology

## 2019-01-23 ENCOUNTER — Encounter (HOSPITAL_COMMUNITY): Payer: Self-pay | Admitting: *Deleted

## 2019-01-23 ENCOUNTER — Ambulatory Visit (HOSPITAL_COMMUNITY)
Admission: RE | Admit: 2019-01-23 | Discharge: 2019-01-23 | Disposition: A | Discharge status: Home / Self Care | Payer: BC Managed Care – PPO | Attending: Gastroenterology | Admitting: Gastroenterology

## 2019-01-23 DIAGNOSIS — Z7984 Long term (current) use of oral hypoglycemic drugs: Secondary | ICD-10-CM | POA: Insufficient documentation

## 2019-01-23 DIAGNOSIS — Z87891 Personal history of nicotine dependence: Secondary | ICD-10-CM | POA: Diagnosis not present

## 2019-01-23 DIAGNOSIS — Z7722 Contact with and (suspected) exposure to environmental tobacco smoke (acute) (chronic): Secondary | ICD-10-CM | POA: Insufficient documentation

## 2019-01-23 DIAGNOSIS — F40240 Claustrophobia: Secondary | ICD-10-CM | POA: Insufficient documentation

## 2019-01-23 DIAGNOSIS — E890 Postprocedural hypothyroidism: Secondary | ICD-10-CM | POA: Diagnosis not present

## 2019-01-23 DIAGNOSIS — J449 Chronic obstructive pulmonary disease, unspecified: Secondary | ICD-10-CM | POA: Insufficient documentation

## 2019-01-23 DIAGNOSIS — Z79890 Hormone replacement therapy (postmenopausal): Secondary | ICD-10-CM | POA: Insufficient documentation

## 2019-01-23 DIAGNOSIS — R978 Other abnormal tumor markers: Secondary | ICD-10-CM | POA: Diagnosis not present

## 2019-01-23 DIAGNOSIS — K862 Cyst of pancreas: Secondary | ICD-10-CM | POA: Insufficient documentation

## 2019-01-23 HISTORY — PX: EUS: SHX5427

## 2019-01-23 HISTORY — DX: Fracture of unspecified carpal bone, right wrist, initial encounter for closed fracture: S62.101A

## 2019-01-23 HISTORY — PX: ESOPHAGOGASTRODUODENOSCOPY (EGD) WITH PROPOFOL: SHX5813

## 2019-01-23 HISTORY — DX: Atherosclerosis of aorta: I70.0

## 2019-01-23 HISTORY — DX: Personal history of diseases of the blood and blood-forming organs and certain disorders involving the immune mechanism: Z86.2

## 2019-01-23 HISTORY — DX: Gastro-esophageal reflux disease without esophagitis: K21.9

## 2019-01-23 HISTORY — PX: FINE NEEDLE ASPIRATION: SHX5430

## 2019-01-23 HISTORY — DX: Generalized abdominal pain: R10.84

## 2019-01-23 HISTORY — DX: Pneumonia, unspecified organism: J18.9

## 2019-01-23 HISTORY — DX: Chronic obstructive pulmonary disease, unspecified: J44.9

## 2019-01-23 HISTORY — DX: Prediabetes: R73.03

## 2019-01-23 HISTORY — DX: Endometriosis of uterus: N80.0

## 2019-01-23 HISTORY — DX: Malignant neoplasm of thyroid gland: C73

## 2019-01-23 HISTORY — DX: Claustrophobia: F40.240

## 2019-01-23 HISTORY — DX: Anxiety disorder, unspecified: F41.9

## 2019-01-23 HISTORY — DX: Depression, unspecified: F32.A

## 2019-01-23 LAB — GLUCOSE, CAPILLARY: Glucose-Capillary: 124 mg/dL — ABNORMAL HIGH (ref 70–99)

## 2019-01-23 LAB — PANC CYST FLD ANLYS-PATHFNDR-TG

## 2019-01-23 SURGERY — ULTRASOUND, UPPER GI TRACT, ENDOSCOPIC
Anesthesia: Monitor Anesthesia Care

## 2019-01-23 MED ORDER — PROPOFOL 500 MG/50ML IV EMUL
INTRAVENOUS | Status: DC | PRN
  Administered 2019-01-23: 140 ug/kg/min via INTRAVENOUS

## 2019-01-23 MED ORDER — SODIUM CHLORIDE 0.9 % IV SOLN: INTRAVENOUS | Status: DC

## 2019-01-23 MED ORDER — LIDOCAINE HCL (CARDIAC) PF 100 MG/5ML IV SOSY
PREFILLED_SYRINGE | INTRAVENOUS | Status: DC | PRN
  Administered 2019-01-23 (×2): 50 mg via INTRATRACHEAL

## 2019-01-23 MED ORDER — PROPOFOL 500 MG/50ML IV EMUL
INTRAVENOUS | Status: DC | PRN
  Administered 2019-01-23 (×3): 20 mg via INTRAVENOUS

## 2019-01-23 MED ORDER — CIPROFLOXACIN IN D5W 400 MG/200ML IV SOLN
INTRAVENOUS | Status: AC
  Filled 2019-01-23: qty 200

## 2019-01-23 MED ORDER — LACTATED RINGERS IV SOLN
INTRAVENOUS | Status: DC
  Administered 2019-01-23: 1000 mL via INTRAVENOUS
  Administered 2019-01-23: 08:00:00 via INTRAVENOUS

## 2019-01-23 MED ORDER — CIPROFLOXACIN IN D5W 400 MG/200ML IV SOLN
INTRAVENOUS | Status: DC | PRN
  Administered 2019-01-23: 400 mg via INTRAVENOUS

## 2019-01-23 MED ORDER — DEXMEDETOMIDINE HCL IN NACL 400 MCG/100ML IV SOLN
INTRAVENOUS | Status: DC | PRN
  Administered 2019-01-23 (×3): 4 ug via INTRAVENOUS

## 2019-01-23 MED ORDER — PROPOFOL 10 MG/ML IV BOLUS
INTRAVENOUS | Status: AC
  Filled 2019-01-23: qty 80

## 2019-01-23 MED ORDER — PROPOFOL 10 MG/ML IV BOLUS
INTRAVENOUS | Status: AC
  Filled 2019-01-23: qty 60

## 2019-01-23 MED ORDER — CIPROFLOXACIN HCL 500 MG PO TABS: 500.0000 mg | ORAL_TABLET | Freq: Two times a day (BID) | ORAL | 0 refills | Status: AC

## 2019-01-23 MED ORDER — GLYCOPYRROLATE 0.2 MG/ML IJ SOLN
INTRAMUSCULAR | Status: DC | PRN
  Administered 2019-01-23: 0.2 mg via INTRAVENOUS

## 2019-01-23 MED ORDER — DEXMEDETOMIDINE HCL IN NACL 200 MCG/50ML IV SOLN
INTRAVENOUS | Status: AC
  Filled 2019-01-23: qty 50

## 2019-01-23 NOTE — Anesthesia Preprocedure Evaluation (Addendum)
Anesthesia Evaluation  Patient identified by MRN, date of birth, ID band Patient awake    Reviewed: Allergy & Precautions, NPO status , Patient's Chart, lab work & pertinent test results  Airway Mallampati: II  TM Distance: >3 FB Neck ROM: Full    Dental no notable dental hx.    Pulmonary COPD, former smoker,    Pulmonary exam normal breath sounds clear to auscultation       Cardiovascular negative cardio ROS Normal cardiovascular exam Rhythm:Regular Rate:Normal     Neuro/Psych negative neurological ROS  negative psych ROS   GI/Hepatic negative GI ROS, Neg liver ROS,   Endo/Other  negative endocrine ROS  Renal/GU negative Renal ROS  negative genitourinary   Musculoskeletal negative musculoskeletal ROS (+)   Abdominal   Peds negative pediatric ROS (+)  Hematology negative hematology ROS (+)   Anesthesia Other Findings Claustrophobia  Reproductive/Obstetrics negative OB ROS                            Anesthesia Physical Anesthesia Plan  ASA: II  Anesthesia Plan: MAC   Post-op Pain Management:    Induction: Intravenous  PONV Risk Score and Plan: 2 and Treatment may vary due to age or medical condition  Airway Management Planned: Nasal Cannula  Additional Equipment:   Intra-op Plan:   Post-operative Plan:   Informed Consent: I have reviewed the patients History and Physical, chart, labs and discussed the procedure including the risks, benefits and alternatives for the proposed anesthesia with the patient or authorized representative who has indicated his/her understanding and acceptance.     Dental advisory given  Plan Discussed with: CRNA  Anesthesia Plan Comments:         Anesthesia Quick Evaluation

## 2019-01-23 NOTE — Op Note (Signed)
[facility] Patient Name: Kimberly Barnett Procedure Date: 01/23/2019 MRN: [id number] Attending MD: Arta Silence , MD Date of Birth: 1955-09-26 CSN: [id number] Age: 63 Admit Type: Outpatient Procedure:                Upper EUS Indications:              Pancreatic cyst on MRI Providers:                Arta Silence, MD, Cleda Daub, RN, Cherylynn Ridges, Technician Referring MD:             Dr. Clarene Essex Medicines:                Monitored Anesthesia Care, Cipro 264 mg IV Complications:            No immediate complications. Estimated Blood Loss:     Estimated blood loss: none. Procedure:                Pre-Anesthesia Assessment:                           - Prior to the procedure, a History and Physical                            was performed, and patient medications and                            allergies were reviewed. The patient's tolerance of                            previous anesthesia was also reviewed. The risks                            and benefits of the procedure and the sedation                            options and risks were discussed with the patient.                            All questions were answered, and informed consent                            was obtained. Prior Anticoagulants: The patient has                            taken no previous anticoagulant or antiplatelet                            agents. ASA Grade Assessment: III - A patient with                            severe systemic disease. After reviewing the risks  and benefits, the patient was deemed in                            satisfactory condition to undergo the procedure.                           After obtaining informed consent, the endoscope was                            passed under direct vision. Throughout the                            procedure, the patient's blood pressure, pulse, and   oxygen saturations were monitored continuously. The                            [id number] ([id number]) Olympus Linear EUS was                            introduced through the mouth, and advanced to the                            second part of duodenum. The upper EUS was                            accomplished without difficulty. The patient                            tolerated the procedure well. Scope In: Scope Out: Findings:      ENDOSONOGRAPHIC FINDING: :      There was no sign of significant endosonographic abnormality in the       common bile duct. An unremarkable gallbladder was identified.      There was no sign of significant endosonographic abnormality in the left       lobe of the liver.      No lymphadenopathy seen.      An anechoic lesion suggestive of a cyst was identified in the pancreatic       tail. It does not communicate with the pancreatic duct. The lesion       measured 20 mm by 20 mm in maximal cross-sectional diameter. There was a       single compartment without septae. The outer wall of the lesion was       thick and slightly irregular. There was no associated mass. There was no       internal debris within the fluid-filled cavity. Therapeutic needle       aspiration for fluid was performed. Color Doppler imaging was utilized       prior to needle puncture to confirm a lack of significant vascular       structures within the needle path. One pass was made with the 22 gauge       needle using a transgastric approach. A stylet was used. Sample(s) were       sent for amylase concentration, cytology and CEA.      No pancreatic ductal dilatation or mass seen in remainder of head, genu       and body of  pancreas. Impression:               - There was no sign of significant pathology in the                            common bile duct.                           - There was no evidence of significant pathology in                            the left lobe of the liver.                            - A cystic lesion was seen in the pancreatic tail.                            Fine needle aspiration for fluid performed. Moderate Sedation:      Not Applicable - Patient had care per Anesthesia. Recommendation:           - Discharge patient to home (via wheelchair).                           - Resume previous diet today.                           - Cipro (ciprofloxacin) 500 mg PO BID for 3 weeks.                           - Await cytology results and await path results.                           - Return to GI clinic after studies are complete. Procedure Code(s):        --- Professional ---                           [phone], Esophagogastroduodenoscopy, flexible,                            transoral; with transendoscopic ultrasound-guided                            intramural or transmural fine needle                            aspiration/biopsy(s) (includes endoscopic                            ultrasound examination of the esophagus, stomach,                            and either the duodenum or a surgically altered                            stomach where the jejunum is examined distal to  the                            anastomosis) Diagnosis Code(s):        --- Professional ---                           K86.2, Cyst of pancreas CPT copyright 2019 American Medical Association. All rights reserved. The codes documented in this report are preliminary and upon coder review may  be revised to meet current compliance requirements. Arta Silence, MD 01/23/2019 9:18:47 AM This report has been signed electronically. Number of Addenda: 0

## 2019-01-23 NOTE — Anesthesia Postprocedure Evaluation (Signed)
Anesthesia Post Note  Patient: Kimberly Barnett  Procedure(s) Performed: FULL UPPER ENDOSCOPIC ULTRASOUND (EUS) RADIAL VS LINEAR   FINE NEEDLE ASPIRATION (N/A ) FINE NEEDLE ASPIRATION (FNA) LINEAR (N/A )     Patient location during evaluation: Endoscopy Anesthesia Type: MAC Level of consciousness: awake and alert Pain management: pain level controlled Vital Signs Assessment: post-procedure vital signs reviewed and stable Respiratory status: spontaneous breathing, nonlabored ventilation, respiratory function stable and patient connected to nasal cannula oxygen Cardiovascular status: stable and blood pressure returned to baseline Postop Assessment: no apparent nausea or vomiting Anesthetic complications: no    Last Vitals:  Vitals:   01/23/19 0913 01/23/19 0930  BP: (!) 99/58 107/76  Pulse: 75 75  Resp: 16 18  Temp: 36.5 C   SpO2: 96% 98%    Last Pain:  Vitals:   01/23/19 0913  TempSrc: Oral  PainSc: 0-No pain                 Montez Hageman

## 2019-01-23 NOTE — H&P (Signed)
Patient interval history reviewed.  Patient examined again.  There has been no change from documented H/P dated 01/22/2019 (scanned into chart from our office) except as documented above.  Assessment:  1.  Pancreatic cyst.  Plan:  1.  Endoscopic ultrasound with possible fine needle aspiration. 2.  Risks (bleeding, infection, bowel perforation that could require surgery, sedation-related changes in cardiopulmonary systems), benefits (identification and possible treatment of source of symptoms, exclusion of certain causes of symptoms), and alternatives (watchful waiting, radiographic imaging studies, empiric medical treatment) of upper endoscopy with ultrasound and possible fine needle aspiration (EUS +/- FNA) were explained to patient/family in detail and patient wishes to proceed.

## 2019-01-23 NOTE — Transfer of Care (Signed)
Immediate Anesthesia Transfer of Care Note  Patient: Kimberly Barnett  Procedure(s) Performed: FULL UPPER ENDOSCOPIC ULTRASOUND (EUS) RADIAL VS LINEAR   FINE NEEDLE ASPIRATION (N/A )  Patient Location: PACU and Endoscopy Unit  Anesthesia Type:MAC  Level of Consciousness: awake, drowsy and patient cooperative  Airway & Oxygen Therapy: Patient Spontanous Breathing and Patient connected to nasal cannula oxygen  Post-op Assessment: Report given to RN and Post -op Vital signs reviewed and stable  Post vital signs: Reviewed and stable  Last Vitals:  Vitals Value Taken Time  BP    Temp    Pulse    Resp    SpO2      Last Pain:  Vitals:   01/23/19 0753  TempSrc: Oral  PainSc: 0-No pain         Complications: No apparent anesthesia complications

## 2019-01-23 NOTE — Discharge Instructions (Signed)
Endoscopic Ultrasound ° °Care After °Please read the instructions outlined below and refer to this sheet in the next few weeks. These discharge instructions provide you with general information on caring for yourself after you leave the hospital. Your doctor may also give you specific instructions. While your treatment has been planned according to the most current medical practices available, unavoidable complications occasionally occur. If you have any problems or questions after discharge, please call Dr. Holley Wirt ([facility]) at [phone]. ° °HOME CARE INSTRUCTIONS °Activity °· You may resume your regular activity but move at a slower pace for the next 24 hours.  °· Take frequent rest periods for the next 24 hours.  °· Walking will help expel (get rid of) the air and reduce the bloated feeling in your abdomen.  °· No driving for 24 hours (because of the anesthesia (medicine) used during the test).  °· You may shower.  °· Do not sign any important legal documents or operate any machinery for 24 hours (because of the anesthesia used during the test).  °Nutrition °· Drink plenty of fluids.  °· You may resume your normal diet.  °· Begin with a light meal and progress to your normal diet.  °· Avoid alcoholic beverages for 24 hours or as instructed by your caregiver.  °Medications °You may resume your normal medications unless your caregiver tells you otherwise. °What you can expect today °· You may experience abdominal discomfort such as a feeling of fullness or "gas" pains.  °· You may experience a sore throat for 2 to 3 days. This is normal. Gargling with salt water may help this.  °·  °SEEK IMMEDIATE MEDICAL CARE IF: °· You have excessive nausea (feeling sick to your stomach) and/or vomiting.  °· You have severe abdominal pain and distention (swelling).  °· You have trouble swallowing.  °· You have a temperature over 100° F (37.8° C).  °· You have rectal bleeding or vomiting of blood.  °Document  Released: 02/23/2004 Document Revised: 03/23/2011 Document Reviewed: 09/05/2007 °ExitCare® Patient Information ©2012 [employer]. °

## 2019-01-24 ENCOUNTER — Encounter (HOSPITAL_COMMUNITY): Payer: Self-pay | Admitting: Gastroenterology

## 2019-02-25 DIAGNOSIS — K862 Cyst of pancreas: Secondary | ICD-10-CM | POA: Diagnosis not present

## 2019-04-02 DIAGNOSIS — E890 Postprocedural hypothyroidism: Secondary | ICD-10-CM | POA: Diagnosis not present

## 2019-04-04 DIAGNOSIS — Z85850 Personal history of malignant neoplasm of thyroid: Secondary | ICD-10-CM | POA: Diagnosis not present

## 2019-04-04 DIAGNOSIS — Z87891 Personal history of nicotine dependence: Secondary | ICD-10-CM | POA: Diagnosis not present

## 2019-04-04 DIAGNOSIS — E890 Postprocedural hypothyroidism: Secondary | ICD-10-CM | POA: Diagnosis not present

## 2019-04-19 DIAGNOSIS — E119 Type 2 diabetes mellitus without complications: Secondary | ICD-10-CM | POA: Diagnosis not present

## 2019-04-19 DIAGNOSIS — E7800 Pure hypercholesterolemia, unspecified: Secondary | ICD-10-CM | POA: Diagnosis not present

## 2019-04-23 DIAGNOSIS — E1169 Type 2 diabetes mellitus with other specified complication: Secondary | ICD-10-CM | POA: Diagnosis not present

## 2019-04-23 DIAGNOSIS — E7800 Pure hypercholesterolemia, unspecified: Secondary | ICD-10-CM | POA: Diagnosis not present

## 2019-04-23 DIAGNOSIS — Z0000 Encounter for general adult medical examination without abnormal findings: Secondary | ICD-10-CM | POA: Diagnosis not present

## 2019-04-23 DIAGNOSIS — N951 Menopausal and female climacteric states: Secondary | ICD-10-CM | POA: Diagnosis not present

## 2019-04-23 DIAGNOSIS — J449 Chronic obstructive pulmonary disease, unspecified: Secondary | ICD-10-CM | POA: Diagnosis not present

## 2019-08-23 DIAGNOSIS — H524 Presbyopia: Secondary | ICD-10-CM | POA: Diagnosis not present

## 2019-08-23 DIAGNOSIS — H25011 Cortical age-related cataract, right eye: Secondary | ICD-10-CM | POA: Diagnosis not present

## 2019-09-05 ENCOUNTER — Other Ambulatory Visit: Payer: Self-pay | Admitting: Family Medicine

## 2019-09-05 ENCOUNTER — Ambulatory Visit
Admission: RE | Admit: 2019-09-05 | Discharge: 2019-09-05 | Disposition: A | Discharge status: Home / Self Care | Payer: BC Managed Care – PPO | Source: Ambulatory Visit | Attending: Family Medicine | Admitting: Family Medicine

## 2019-09-05 ENCOUNTER — Other Ambulatory Visit: Payer: Self-pay

## 2019-09-05 DIAGNOSIS — R0781 Pleurodynia: Secondary | ICD-10-CM | POA: Diagnosis not present

## 2019-09-05 DIAGNOSIS — S299XXA Unspecified injury of thorax, initial encounter: Secondary | ICD-10-CM | POA: Diagnosis not present

## 2019-09-27 DIAGNOSIS — Z85850 Personal history of malignant neoplasm of thyroid: Secondary | ICD-10-CM | POA: Diagnosis not present

## 2019-09-27 DIAGNOSIS — E890 Postprocedural hypothyroidism: Secondary | ICD-10-CM | POA: Diagnosis not present

## 2019-10-02 DIAGNOSIS — H25011 Cortical age-related cataract, right eye: Secondary | ICD-10-CM | POA: Diagnosis not present

## 2019-10-02 DIAGNOSIS — H25811 Combined forms of age-related cataract, right eye: Secondary | ICD-10-CM | POA: Diagnosis not present

## 2019-10-02 DIAGNOSIS — H2511 Age-related nuclear cataract, right eye: Secondary | ICD-10-CM | POA: Diagnosis not present

## 2019-10-07 DIAGNOSIS — E890 Postprocedural hypothyroidism: Secondary | ICD-10-CM | POA: Diagnosis not present

## 2019-10-07 DIAGNOSIS — Z85850 Personal history of malignant neoplasm of thyroid: Secondary | ICD-10-CM | POA: Diagnosis not present

## 2019-10-22 DIAGNOSIS — Z85850 Personal history of malignant neoplasm of thyroid: Secondary | ICD-10-CM | POA: Diagnosis not present

## 2019-10-22 DIAGNOSIS — R0781 Pleurodynia: Secondary | ICD-10-CM | POA: Diagnosis not present

## 2019-10-22 DIAGNOSIS — E1169 Type 2 diabetes mellitus with other specified complication: Secondary | ICD-10-CM | POA: Diagnosis not present

## 2019-10-22 DIAGNOSIS — E890 Postprocedural hypothyroidism: Secondary | ICD-10-CM | POA: Diagnosis not present

## 2019-10-25 ENCOUNTER — Other Ambulatory Visit: Payer: Self-pay | Admitting: Family Medicine

## 2019-10-25 DIAGNOSIS — Z1231 Encounter for screening mammogram for malignant neoplasm of breast: Secondary | ICD-10-CM

## 2020-03-18 ENCOUNTER — Other Ambulatory Visit: Payer: Self-pay | Admitting: Gastroenterology

## 2020-03-18 DIAGNOSIS — K862 Cyst of pancreas: Secondary | ICD-10-CM

## 2020-04-08 ENCOUNTER — Other Ambulatory Visit: Payer: Self-pay | Admitting: Gastroenterology

## 2020-04-11 ENCOUNTER — Ambulatory Visit
Admission: RE | Admit: 2020-04-11 | Discharge: 2020-04-11 | Disposition: A | Discharge status: Home / Self Care | Payer: BC Managed Care – PPO | Source: Ambulatory Visit | Attending: Gastroenterology | Admitting: Gastroenterology

## 2020-04-11 ENCOUNTER — Other Ambulatory Visit: Payer: Self-pay

## 2020-04-11 DIAGNOSIS — K862 Cyst of pancreas: Secondary | ICD-10-CM

## 2020-04-11 MED ORDER — GADOBENATE DIMEGLUMINE 529 MG/ML IV SOLN
17.0000 mL | Freq: Once | INTRAVENOUS | Status: AC | PRN
  Administered 2020-04-11: 17 mL via INTRAVENOUS

## 2020-04-29 ENCOUNTER — Other Ambulatory Visit: Payer: Self-pay | Admitting: Family Medicine

## 2020-04-29 DIAGNOSIS — N644 Mastodynia: Secondary | ICD-10-CM

## 2020-05-20 ENCOUNTER — Other Ambulatory Visit: Payer: Self-pay

## 2020-05-20 ENCOUNTER — Ambulatory Visit
Admission: RE | Admit: 2020-05-20 | Discharge: 2020-05-20 | Disposition: A | Discharge status: Home / Self Care | Payer: 59 | Source: Ambulatory Visit | Attending: Family Medicine | Admitting: Family Medicine

## 2020-05-20 ENCOUNTER — Ambulatory Visit: Payer: 59

## 2020-05-20 DIAGNOSIS — N644 Mastodynia: Secondary | ICD-10-CM

## 2020-08-26 ENCOUNTER — Other Ambulatory Visit: Payer: Self-pay | Admitting: Endocrinology

## 2020-08-26 DIAGNOSIS — R5381 Other malaise: Secondary | ICD-10-CM

## 2020-08-31 ENCOUNTER — Other Ambulatory Visit: Payer: Self-pay | Admitting: Endocrinology

## 2020-08-31 DIAGNOSIS — Z780 Asymptomatic menopausal state: Secondary | ICD-10-CM

## 2021-02-26 ENCOUNTER — Ambulatory Visit
Admission: RE | Admit: 2021-02-26 | Discharge: 2021-02-26 | Disposition: A | Discharge status: Home / Self Care | Payer: 59 | Source: Ambulatory Visit | Attending: Endocrinology | Admitting: Endocrinology

## 2021-02-26 ENCOUNTER — Other Ambulatory Visit: Payer: Self-pay

## 2021-02-26 DIAGNOSIS — Z780 Asymptomatic menopausal state: Secondary | ICD-10-CM

## 2021-04-13 ENCOUNTER — Other Ambulatory Visit: Payer: Self-pay | Admitting: Gastroenterology

## 2021-04-13 DIAGNOSIS — K862 Cyst of pancreas: Secondary | ICD-10-CM

## 2021-04-21 ENCOUNTER — Other Ambulatory Visit: Payer: Self-pay | Admitting: Family Medicine

## 2021-04-21 DIAGNOSIS — Z1231 Encounter for screening mammogram for malignant neoplasm of breast: Secondary | ICD-10-CM

## 2021-05-02 ENCOUNTER — Ambulatory Visit
Admission: RE | Admit: 2021-05-02 | Discharge: 2021-05-02 | Disposition: A | Discharge status: Home / Self Care | Payer: 59 | Source: Ambulatory Visit | Attending: Gastroenterology | Admitting: Gastroenterology

## 2021-05-02 DIAGNOSIS — K862 Cyst of pancreas: Secondary | ICD-10-CM

## 2021-05-02 MED ORDER — GADOBENATE DIMEGLUMINE 529 MG/ML IV SOLN
15.0000 mL | Freq: Once | INTRAVENOUS | Status: AC | PRN
  Administered 2021-05-02: 15 mL via INTRAVENOUS

## 2021-05-24 ENCOUNTER — Ambulatory Visit
Admission: RE | Admit: 2021-05-24 | Discharge: 2021-05-24 | Disposition: A | Discharge status: Home / Self Care | Payer: 59 | Source: Ambulatory Visit | Attending: Family Medicine | Admitting: Family Medicine

## 2021-05-24 ENCOUNTER — Other Ambulatory Visit: Payer: Self-pay

## 2021-05-24 DIAGNOSIS — Z1231 Encounter for screening mammogram for malignant neoplasm of breast: Secondary | ICD-10-CM

## 2021-05-26 ENCOUNTER — Other Ambulatory Visit: Payer: Self-pay | Admitting: Family Medicine

## 2021-05-26 DIAGNOSIS — R928 Other abnormal and inconclusive findings on diagnostic imaging of breast: Secondary | ICD-10-CM

## 2021-06-08 ENCOUNTER — Ambulatory Visit
Admission: RE | Admit: 2021-06-08 | Discharge: 2021-06-08 | Disposition: A | Discharge status: Home / Self Care | Payer: 59 | Source: Ambulatory Visit | Attending: Family Medicine | Admitting: Family Medicine

## 2021-06-08 ENCOUNTER — Ambulatory Visit: Payer: 59

## 2021-06-08 ENCOUNTER — Other Ambulatory Visit: Payer: Self-pay

## 2021-06-08 DIAGNOSIS — R928 Other abnormal and inconclusive findings on diagnostic imaging of breast: Secondary | ICD-10-CM

## 2021-11-05 ENCOUNTER — Other Ambulatory Visit (HOSPITAL_COMMUNITY): Payer: Self-pay | Admitting: Surgery

## 2021-11-05 ENCOUNTER — Other Ambulatory Visit: Payer: Self-pay | Admitting: Surgery

## 2021-11-05 DIAGNOSIS — K862 Cyst of pancreas: Secondary | ICD-10-CM

## 2021-12-18 ENCOUNTER — Ambulatory Visit (HOSPITAL_COMMUNITY)
Admission: RE | Admit: 2021-12-18 | Discharge: 2021-12-18 | Disposition: A | Discharge status: Home / Self Care | Payer: 59 | Source: Ambulatory Visit | Attending: Surgery | Admitting: Surgery

## 2021-12-18 DIAGNOSIS — K862 Cyst of pancreas: Secondary | ICD-10-CM

## 2021-12-18 MED ORDER — GADOBUTROL 1 MMOL/ML IV SOLN
8.1000 mL | Freq: Once | INTRAVENOUS | Status: AC | PRN
  Administered 2021-12-18: 8.1 mL via INTRAVENOUS

## 2022-08-09 DIAGNOSIS — J449 Chronic obstructive pulmonary disease, unspecified: Secondary | ICD-10-CM | POA: Diagnosis not present

## 2022-08-09 DIAGNOSIS — Z6829 Body mass index (BMI) 29.0-29.9, adult: Secondary | ICD-10-CM | POA: Diagnosis not present

## 2022-08-09 DIAGNOSIS — Z0000 Encounter for general adult medical examination without abnormal findings: Secondary | ICD-10-CM | POA: Diagnosis not present

## 2022-08-09 DIAGNOSIS — E7800 Pure hypercholesterolemia, unspecified: Secondary | ICD-10-CM | POA: Diagnosis not present

## 2022-08-09 DIAGNOSIS — I700 Atherosclerosis of aorta: Secondary | ICD-10-CM | POA: Diagnosis not present

## 2022-08-09 DIAGNOSIS — E1169 Type 2 diabetes mellitus with other specified complication: Secondary | ICD-10-CM | POA: Diagnosis not present

## 2022-08-09 DIAGNOSIS — M810 Age-related osteoporosis without current pathological fracture: Secondary | ICD-10-CM | POA: Diagnosis not present

## 2022-08-09 DIAGNOSIS — R69 Illness, unspecified: Secondary | ICD-10-CM | POA: Diagnosis not present

## 2022-09-07 DIAGNOSIS — C73 Malignant neoplasm of thyroid gland: Secondary | ICD-10-CM | POA: Diagnosis not present

## 2022-09-07 DIAGNOSIS — E890 Postprocedural hypothyroidism: Secondary | ICD-10-CM | POA: Diagnosis not present

## 2022-09-07 DIAGNOSIS — E1165 Type 2 diabetes mellitus with hyperglycemia: Secondary | ICD-10-CM | POA: Diagnosis not present

## 2022-09-07 DIAGNOSIS — M810 Age-related osteoporosis without current pathological fracture: Secondary | ICD-10-CM | POA: Diagnosis not present

## 2022-09-07 DIAGNOSIS — E7800 Pure hypercholesterolemia, unspecified: Secondary | ICD-10-CM | POA: Diagnosis not present

## 2022-09-14 DIAGNOSIS — C73 Malignant neoplasm of thyroid gland: Secondary | ICD-10-CM | POA: Diagnosis not present

## 2022-09-14 DIAGNOSIS — E1165 Type 2 diabetes mellitus with hyperglycemia: Secondary | ICD-10-CM | POA: Diagnosis not present

## 2022-09-14 DIAGNOSIS — Z780 Asymptomatic menopausal state: Secondary | ICD-10-CM | POA: Diagnosis not present

## 2022-09-14 DIAGNOSIS — E890 Postprocedural hypothyroidism: Secondary | ICD-10-CM | POA: Diagnosis not present

## 2022-09-14 DIAGNOSIS — E7800 Pure hypercholesterolemia, unspecified: Secondary | ICD-10-CM | POA: Diagnosis not present

## 2022-09-14 DIAGNOSIS — M810 Age-related osteoporosis without current pathological fracture: Secondary | ICD-10-CM | POA: Diagnosis not present

## 2022-10-19 DIAGNOSIS — E1122 Type 2 diabetes mellitus with diabetic chronic kidney disease: Secondary | ICD-10-CM | POA: Diagnosis not present

## 2022-10-19 DIAGNOSIS — Z7984 Long term (current) use of oral hypoglycemic drugs: Secondary | ICD-10-CM | POA: Diagnosis not present

## 2022-10-19 DIAGNOSIS — N182 Chronic kidney disease, stage 2 (mild): Secondary | ICD-10-CM | POA: Diagnosis not present

## 2022-10-19 DIAGNOSIS — Z886 Allergy status to analgesic agent status: Secondary | ICD-10-CM | POA: Diagnosis not present

## 2022-10-19 DIAGNOSIS — E890 Postprocedural hypothyroidism: Secondary | ICD-10-CM | POA: Diagnosis not present

## 2022-10-19 DIAGNOSIS — Z87891 Personal history of nicotine dependence: Secondary | ICD-10-CM | POA: Diagnosis not present

## 2022-10-19 DIAGNOSIS — F324 Major depressive disorder, single episode, in partial remission: Secondary | ICD-10-CM | POA: Diagnosis not present

## 2022-10-19 DIAGNOSIS — I129 Hypertensive chronic kidney disease with stage 1 through stage 4 chronic kidney disease, or unspecified chronic kidney disease: Secondary | ICD-10-CM | POA: Diagnosis not present

## 2022-10-19 DIAGNOSIS — J449 Chronic obstructive pulmonary disease, unspecified: Secondary | ICD-10-CM | POA: Diagnosis not present

## 2022-10-19 DIAGNOSIS — Z5986 Financial insecurity: Secondary | ICD-10-CM | POA: Diagnosis not present

## 2022-10-19 DIAGNOSIS — G8929 Other chronic pain: Secondary | ICD-10-CM | POA: Diagnosis not present

## 2022-10-26 DIAGNOSIS — E890 Postprocedural hypothyroidism: Secondary | ICD-10-CM | POA: Diagnosis not present

## 2022-11-02 DIAGNOSIS — Z5320 Procedure and treatment not carried out because of patient's decision for unspecified reasons: Secondary | ICD-10-CM | POA: Diagnosis not present

## 2022-11-02 DIAGNOSIS — C73 Malignant neoplasm of thyroid gland: Secondary | ICD-10-CM | POA: Diagnosis not present

## 2022-11-02 DIAGNOSIS — E1165 Type 2 diabetes mellitus with hyperglycemia: Secondary | ICD-10-CM | POA: Diagnosis not present

## 2022-11-02 DIAGNOSIS — E890 Postprocedural hypothyroidism: Secondary | ICD-10-CM | POA: Diagnosis not present

## 2022-11-02 DIAGNOSIS — Z780 Asymptomatic menopausal state: Secondary | ICD-10-CM | POA: Diagnosis not present

## 2022-11-02 DIAGNOSIS — M810 Age-related osteoporosis without current pathological fracture: Secondary | ICD-10-CM | POA: Diagnosis not present

## 2022-11-02 DIAGNOSIS — J45909 Unspecified asthma, uncomplicated: Secondary | ICD-10-CM | POA: Diagnosis not present

## 2022-11-02 DIAGNOSIS — E7800 Pure hypercholesterolemia, unspecified: Secondary | ICD-10-CM | POA: Diagnosis not present

## 2022-12-16 ENCOUNTER — Emergency Department (HOSPITAL_COMMUNITY): Payer: 59

## 2022-12-16 ENCOUNTER — Emergency Department (HOSPITAL_COMMUNITY)
Admission: EM | Admit: 2022-12-16 | Discharge: 2022-12-16 | Disposition: A | Discharge status: Home / Self Care | Payer: 59 | Attending: Emergency Medicine | Admitting: Emergency Medicine

## 2022-12-16 ENCOUNTER — Other Ambulatory Visit: Payer: Self-pay

## 2022-12-16 DIAGNOSIS — Z7951 Long term (current) use of inhaled steroids: Secondary | ICD-10-CM | POA: Insufficient documentation

## 2022-12-16 DIAGNOSIS — R0602 Shortness of breath: Secondary | ICD-10-CM | POA: Diagnosis not present

## 2022-12-16 DIAGNOSIS — J441 Chronic obstructive pulmonary disease with (acute) exacerbation: Secondary | ICD-10-CM | POA: Insufficient documentation

## 2022-12-16 LAB — CBC WITH DIFFERENTIAL/PLATELET
Abs Immature Granulocytes: 0.04 10*3/uL (ref 0.00–0.07)
Basophils Absolute: 0.1 10*3/uL (ref 0.0–0.1)
Basophils Relative: 1 %
Eosinophils Absolute: 0.6 10*3/uL — ABNORMAL HIGH (ref 0.0–0.5)
Eosinophils Relative: 5 %
HCT: 37.9 % (ref 36.0–46.0)
Hemoglobin: 12.4 g/dL (ref 12.0–15.0)
Immature Granulocytes: 0 %
Lymphocytes Relative: 26 %
Lymphs Abs: 3.4 10*3/uL (ref 0.7–4.0)
MCH: 28.8 pg (ref 26.0–34.0)
MCHC: 32.7 g/dL (ref 30.0–36.0)
MCV: 87.9 fL (ref 80.0–100.0)
Monocytes Absolute: 0.8 10*3/uL (ref 0.1–1.0)
Monocytes Relative: 6 %
Neutro Abs: 7.9 10*3/uL — ABNORMAL HIGH (ref 1.7–7.7)
Neutrophils Relative %: 62 %
Platelets: 295 10*3/uL (ref 150–400)
RBC: 4.31 MIL/uL (ref 3.87–5.11)
RDW: 14.6 % (ref 11.5–15.5)
WBC: 12.8 10*3/uL — ABNORMAL HIGH (ref 4.0–10.5)
nRBC: 0 % (ref 0.0–0.2)

## 2022-12-16 LAB — COMPREHENSIVE METABOLIC PANEL
ALT: 12 U/L (ref 0–44)
AST: 20 U/L (ref 15–41)
Albumin: 3.4 g/dL — ABNORMAL LOW (ref 3.5–5.0)
Alkaline Phosphatase: 60 U/L (ref 38–126)
Anion gap: 8 (ref 5–15)
BUN: 23 mg/dL (ref 8–23)
CO2: 23 mmol/L (ref 22–32)
Calcium: 8.9 mg/dL (ref 8.9–10.3)
Chloride: 106 mmol/L (ref 98–111)
Creatinine, Ser: 0.89 mg/dL (ref 0.44–1.00)
GFR, Estimated: >60 mL/min (ref >60)
Glucose, Bld: 118 mg/dL — ABNORMAL HIGH (ref 70–99)
Potassium: 4.2 mmol/L (ref 3.5–5.1)
Sodium: 137 mmol/L (ref 135–145)
Total Bilirubin: 0.9 mg/dL (ref 0.3–1.2)
Total Protein: 7 g/dL (ref 6.5–8.1)

## 2022-12-16 LAB — BRAIN NATRIURETIC PEPTIDE: B Natriuretic Peptide: 24 pg/mL (ref 0.0–100.0)

## 2022-12-16 LAB — TROPONIN I (HIGH SENSITIVITY): Troponin I (High Sensitivity): 3 ng/L (ref <18)

## 2022-12-16 MED ORDER — DOXYCYCLINE HYCLATE 100 MG PO CAPS: 100.0000 mg | ORAL_CAPSULE | Freq: Two times a day (BID) | ORAL | 0 refills | Status: AC

## 2022-12-16 MED ORDER — PREDNISONE 20 MG PO TABS
40.0000 mg | ORAL_TABLET | Freq: Once | ORAL | Status: AC
  Administered 2022-12-16: 40 mg via ORAL
  Filled 2022-12-16: qty 2

## 2022-12-16 MED ORDER — ALBUTEROL SULFATE HFA 108 (90 BASE) MCG/ACT IN AERS
2.0000 | INHALATION_SPRAY | RESPIRATORY_TRACT | Status: DC | PRN
  Filled 2022-12-16: qty 6.7

## 2022-12-16 MED ORDER — IPRATROPIUM-ALBUTEROL 0.5-2.5 (3) MG/3ML IN SOLN
3.0000 mL | Freq: Once | RESPIRATORY_TRACT | Status: AC
  Administered 2022-12-16: 3 mL via RESPIRATORY_TRACT
  Filled 2022-12-16: qty 3

## 2022-12-16 MED ORDER — METHYLPREDNISOLONE SODIUM SUCC 125 MG IJ SOLR
125.0000 mg | Freq: Once | INTRAMUSCULAR | Status: AC
  Administered 2022-12-16: 125 mg via INTRAVENOUS
  Filled 2022-12-16: qty 2

## 2022-12-16 MED ORDER — DOXYCYCLINE HYCLATE 100 MG PO TABS
100.0000 mg | ORAL_TABLET | Freq: Once | ORAL | Status: AC
  Administered 2022-12-16: 100 mg via ORAL
  Filled 2022-12-16: qty 1

## 2022-12-16 MED ORDER — PREDNISONE 20 MG PO TABS: ORAL_TABLET | ORAL | 0 refills | Status: AC

## 2022-12-16 MED ORDER — BENZONATATE 100 MG PO CAPS: 200.0000 mg | ORAL_CAPSULE | Freq: Three times a day (TID) | ORAL | 0 refills | Status: AC | PRN

## 2022-12-16 MED ORDER — ALBUTEROL SULFATE HFA 108 (90 BASE) MCG/ACT IN AERS
2.0000 | INHALATION_SPRAY | RESPIRATORY_TRACT | Status: DC | PRN
  Administered 2022-12-16: 2 via RESPIRATORY_TRACT

## 2022-12-16 NOTE — Discharge Instructions (Addendum)
1.  Continue to use the albuterol inhaler with a spacer 2 puffs every 4 hours as needed for wheezing and coughing. 2.  You were given a dose of doxycycline in the emergency department this is an antibiotic.  Continue this twice daily for the next week. 3.  You were given prednisone and Solu-Medrol in the emergency department.  Continue taking tomorrow as prescribed. 4.  Follow-up with your doctor soon as possible for recheck.  You should have your baseline COPD inhalers restarted. 5.  Your blood pressure was elevated in the emergency department.  This may be due to stress and illness.  This should be monitored in a calm situation.  You should have repeat check at your doctor's office.  Untreated high blood pressure can lead to serious medical illnesses such as heart attack, stroke and kidney failure. 6.  Return to the emergency department immediately if you have new worsening or concerning symptoms.

## 2022-12-16 NOTE — ED Triage Notes (Signed)
Pt arrived via POV. Pt c/o cough, and SOB for 2x months with increasing severity.  Hx COPD  AOx4

## 2022-12-16 NOTE — ED Provider Notes (Signed)
[place] EMERGENCY DEPARTMENT AT [facility] Provider Note   CSN: [id number] Arrival date & time: 12/16/22  1750     History  Chief Complaint  Patient presents with   Shortness of Breath   Cough    Kimberly Barnett is a 67 y.o. female.  HPI Patient reports she has a history of COPD.  She reports since she quit smoking in January she has been having trouble with shortness of breath and cough.  She reports she also ran out of her Spiriva in February.  Due to financial constraints, she has not been able to afford it.  Reports she has had some albuterol rescue inhaler and it used to help but here lately her symptoms of worsened that she is getting very little improvement.  She reports that she has had a very dry and persistent cough now for several weeks.  No fever.  She reports she only has chest pain when she is coughing really hard.  No lower extremity swelling or calf pain.    Home Medications Prior to Admission medications   Medication Sig Start Date End Date Taking? Authorizing Provider  benzonatate (TESSALON PERLES) 100 MG capsule Take 2 capsules (200 mg total) by mouth 3 (three) times daily as needed for cough. 12/16/22  Yes Arby Barrette, MD  doxycycline (VIBRAMYCIN) 100 MG capsule Take 1 capsule (100 mg total) by mouth 2 (two) times daily. 12/16/22  Yes Arby Barrette, MD  predniSONE (DELTASONE) 20 MG tablet 2 tabs po daily x 4 days 12/16/22  Yes Raykwon Hobbs, Lebron Conners, MD  albuterol (VENTOLIN HFA) 108 (90 Base) MCG/ACT inhaler Inhale 1-2 puffs into the lungs every 6 (six) hours as needed for wheezing or shortness of breath.    [provider]  Calcium-Magnesium-Zinc (CAL-MAG-ZINC PO) Take 1 tablet by mouth daily.    [provider]  Cholecalciferol (VITAMIN D3 PO) Take 1 tablet by mouth daily.    [provider]  Cyanocobalamin (VITAMIN B-12 PO) Take 1 tablet by mouth daily.    [provider]  metFORMIN (GLUCOPHAGE) 500 MG tablet Take  500 mg by mouth 2 (two) times daily. 08/15/18   [provider]  SPIRIVA RESPIMAT 2.5 MCG/ACT AERS Inhale 2 puffs into the lungs daily. 05/28/18   [provider]  SYNTHROID 137 MCG tablet Take 137 mcg by mouth daily at 6 (six) AM. 09/26/18   [provider]      Allergies    Nsaids and Zofran [ondansetron hcl]    Review of Systems   Review of Systems  Physical Exam Updated Vital Signs BP (!) 155/101   Pulse (!) 105   Temp 98.3 F (36.8 C) (Oral)   Resp 19   Ht 5\' 7"  (1.702 m)   Wt 84.8 kg   SpO2 100%   BMI 29.29 kg/m  Physical Exam Constitutional:      Comments: Patient is alert and nontoxic.  Mild to moderate increased work of breathing.  Speaking in full clear sentences.  Intermittent cough paroxysmal  HENT:     Mouth/Throat:     Pharynx: Oropharynx is clear.  Eyes:     Extraocular Movements: Extraocular movements intact.  Cardiovascular:     Comments: Borderline tachycardia.  No appreciable rub murmur gallop. Pulmonary:     Comments: Mild to moderate increased work of breathing at rest.  Speaking in full clear sentences.  Wheezing throughout the lung fields.  Soft at the bases.  No crackle. Abdominal:  General: There is no distension.     Palpations: Abdomen is soft.     Tenderness: There is no abdominal tenderness. There is no guarding.  Musculoskeletal:        General: No swelling or tenderness. Normal range of motion.     Right lower leg: No edema.     Left lower leg: No edema.  Skin:    General: Skin is warm and dry.  Neurological:     General: No focal deficit present.     Mental Status: She is oriented to person, place, and time.     Coordination: Coordination normal.  Psychiatric:        Mood and Affect: Mood normal.     ED Results / Procedures / Treatments   Labs (all labs ordered are listed, but only abnormal results are displayed) Labs Reviewed  COMPREHENSIVE METABOLIC PANEL - Abnormal; Notable for the following  components:      Result Value   Glucose, Bld 118 (*)    Albumin 3.4 (*)    All other components within normal limits  CBC WITH DIFFERENTIAL/PLATELET - Abnormal; Notable for the following components:   WBC 12.8 (*)    Neutro Abs 7.9 (*)    Eosinophils Absolute 0.6 (*)    All other components within normal limits  BRAIN NATRIURETIC PEPTIDE  TROPONIN I (HIGH SENSITIVITY)  TROPONIN I (HIGH SENSITIVITY)    EKG EKG Interpretation  Date/Time:  Friday Dec 16 2022 18:01:20 EDT Ventricular Rate:  98 PR Interval:  163 QRS Duration: 83 QT Interval:  324 QTC Calculation: 414 R Axis:   16 Text Interpretation: Sinus rhythm Abnormal R-wave progression, early transition Nonspecific T abnormalities, lateral leads tachycardia but otherwise no sig chnage from previous Confirmed by Arby Barrette [phone]) on 12/16/2022 6:56:43 PM  Radiology DG Chest 2 View  Result Date: 12/16/2022 CLINICAL DATA:  Shortness of breath EXAM: CHEST - 2 VIEW COMPARISON:  09/05/2019 FINDINGS: The heart size and mediastinal contours are within normal limits. Both lungs are clear. The visualized skeletal structures are unremarkable. IMPRESSION: No active cardiopulmonary disease. Electronically Signed   By: Charlett Nose M.D.   On: 12/16/2022 18:32    Procedures Procedures    Medications Ordered in ED Medications  albuterol (VENTOLIN HFA) 108 (90 Base) MCG/ACT inhaler 2 puff (has no administration in time range)  albuterol (VENTOLIN HFA) 108 (90 Base) MCG/ACT inhaler 2 puff (has no administration in time range)  predniSONE (DELTASONE) tablet 40 mg (has no administration in time range)  doxycycline (VIBRA-TABS) tablet 100 mg (has no administration in time range)  ipratropium-albuterol (DUONEB) 0.5-2.5 (3) MG/3ML nebulizer solution 3 mL (3 mLs Nebulization Given 12/16/22 1907)  methylPREDNISolone sodium succinate (SOLU-MEDROL) 125 mg/2 mL injection 125 mg (125 mg Intravenous Given 12/16/22 1907)    ED Course/ Medical  Decision Making/ A&P                             Medical Decision Making Amount and/or Complexity of Data Reviewed Labs: ordered. Radiology: ordered.  Risk Prescription drug management.   Presents as outlined with prior history of COPD.  She does have smoking history having quit within the past 5 months.  She has also been out of Spiriva for about the same amount of time.  She appears to be having an exacerbation over at least the past several weeks with tight cough and worsening shortness of breath.  On exam, patient is nontoxic.  Will proceed as diagnostic evaluation for pneumonia\COPD exacerbation\ACS.  Chest x-ray reviewed by radiology no acute infiltrates or congestion.  I have also personally reviewed this x-ray.  Normal mediastinal structures.  No areas of consolidation.  BNP and troponin are normal.  White count mildly elevated.  Completing DuoNeb, patient felt significantly improved.  Repeat auscultation she still has expiratory wheeze but has good airflow.  Patient is nontoxic and alert.  This time symptoms most consistent with COPD exacerbation.  Since patient has had worsening symptoms of productive cough will add doxycycline.  Plan will be for prednisone, Tessalon Perle and doxycycline with albuterol inhaler.  Patient is counseled on necessity for close follow-up with PCP to reinstate baseline management of COPD.  Voices understanding.        Final Clinical Impression(s) / ED Diagnoses Final diagnoses:  COPD exacerbation (HCC)    Rx / DC Orders ED Discharge Orders          Ordered    doxycycline (VIBRAMYCIN) 100 MG capsule  2 times daily        12/16/22 2056    predniSONE (DELTASONE) 20 MG tablet        12/16/22 2056    benzonatate (TESSALON PERLES) 100 MG capsule  3 times daily PRN        12/16/22 2056              Arby Barrette, MD 12/16/22 2102

## 2022-12-17 DIAGNOSIS — J441 Chronic obstructive pulmonary disease with (acute) exacerbation: Secondary | ICD-10-CM | POA: Diagnosis not present

## 2023-01-02 DIAGNOSIS — J441 Chronic obstructive pulmonary disease with (acute) exacerbation: Secondary | ICD-10-CM | POA: Diagnosis not present

## 2023-01-02 DIAGNOSIS — Z6830 Body mass index (BMI) 30.0-30.9, adult: Secondary | ICD-10-CM | POA: Diagnosis not present

## 2023-01-02 DIAGNOSIS — Z87891 Personal history of nicotine dependence: Secondary | ICD-10-CM | POA: Diagnosis not present

## 2023-01-02 DIAGNOSIS — J449 Chronic obstructive pulmonary disease, unspecified: Secondary | ICD-10-CM | POA: Diagnosis not present

## 2023-02-09 DIAGNOSIS — E669 Obesity, unspecified: Secondary | ICD-10-CM | POA: Diagnosis not present

## 2023-02-09 DIAGNOSIS — E890 Postprocedural hypothyroidism: Secondary | ICD-10-CM | POA: Diagnosis not present

## 2023-02-09 DIAGNOSIS — Z85850 Personal history of malignant neoplasm of thyroid: Secondary | ICD-10-CM | POA: Diagnosis not present

## 2023-02-09 DIAGNOSIS — Z6830 Body mass index (BMI) 30.0-30.9, adult: Secondary | ICD-10-CM | POA: Diagnosis not present

## 2023-02-09 DIAGNOSIS — Z87891 Personal history of nicotine dependence: Secondary | ICD-10-CM | POA: Diagnosis not present

## 2023-02-09 DIAGNOSIS — M810 Age-related osteoporosis without current pathological fracture: Secondary | ICD-10-CM | POA: Diagnosis not present

## 2023-02-09 DIAGNOSIS — F330 Major depressive disorder, recurrent, mild: Secondary | ICD-10-CM | POA: Diagnosis not present

## 2023-02-09 DIAGNOSIS — E119 Type 2 diabetes mellitus without complications: Secondary | ICD-10-CM | POA: Diagnosis not present

## 2023-02-09 DIAGNOSIS — J449 Chronic obstructive pulmonary disease, unspecified: Secondary | ICD-10-CM | POA: Diagnosis not present

## 2023-03-08 ENCOUNTER — Other Ambulatory Visit (HOSPITAL_COMMUNITY): Payer: Self-pay | Admitting: Surgery

## 2023-03-08 DIAGNOSIS — K862 Cyst of pancreas: Secondary | ICD-10-CM

## 2023-04-24 DIAGNOSIS — F330 Major depressive disorder, recurrent, mild: Secondary | ICD-10-CM | POA: Diagnosis not present

## 2023-04-24 DIAGNOSIS — M2550 Pain in unspecified joint: Secondary | ICD-10-CM | POA: Diagnosis not present

## 2023-04-24 DIAGNOSIS — Z6830 Body mass index (BMI) 30.0-30.9, adult: Secondary | ICD-10-CM | POA: Diagnosis not present

## 2023-04-24 DIAGNOSIS — J449 Chronic obstructive pulmonary disease, unspecified: Secondary | ICD-10-CM | POA: Diagnosis not present

## 2023-04-24 DIAGNOSIS — Z9989 Dependence on other enabling machines and devices: Secondary | ICD-10-CM | POA: Diagnosis not present

## 2023-04-28 ENCOUNTER — Ambulatory Visit (HOSPITAL_COMMUNITY): Payer: Medicare HMO

## 2023-05-06 ENCOUNTER — Ambulatory Visit (HOSPITAL_COMMUNITY)
Admission: RE | Admit: 2023-05-06 | Discharge: 2023-05-06 | Disposition: A | Discharge status: Home / Self Care | Payer: Medicare HMO | Source: Ambulatory Visit | Attending: Surgery | Admitting: Surgery

## 2023-05-06 ENCOUNTER — Other Ambulatory Visit (HOSPITAL_COMMUNITY): Payer: Self-pay | Admitting: Surgery

## 2023-05-06 DIAGNOSIS — K862 Cyst of pancreas: Secondary | ICD-10-CM | POA: Diagnosis not present

## 2023-05-06 DIAGNOSIS — K8020 Calculus of gallbladder without cholecystitis without obstruction: Secondary | ICD-10-CM | POA: Diagnosis not present

## 2023-05-06 DIAGNOSIS — I700 Atherosclerosis of aorta: Secondary | ICD-10-CM | POA: Diagnosis not present

## 2023-05-06 MED ORDER — GADOBUTROL 1 MMOL/ML IV SOLN
8.0000 mL | Freq: Once | INTRAVENOUS | Status: AC | PRN
  Administered 2023-05-06: 8 mL via INTRAVENOUS

## 2023-05-22 ENCOUNTER — Other Ambulatory Visit: Payer: Self-pay | Admitting: Pediatrics

## 2023-05-22 DIAGNOSIS — E559 Vitamin D deficiency, unspecified: Secondary | ICD-10-CM | POA: Diagnosis not present

## 2023-05-22 DIAGNOSIS — Z1231 Encounter for screening mammogram for malignant neoplasm of breast: Secondary | ICD-10-CM | POA: Diagnosis not present

## 2023-05-22 DIAGNOSIS — M545 Low back pain, unspecified: Secondary | ICD-10-CM | POA: Diagnosis not present

## 2023-05-22 DIAGNOSIS — E1169 Type 2 diabetes mellitus with other specified complication: Secondary | ICD-10-CM | POA: Diagnosis not present

## 2023-05-22 DIAGNOSIS — J42 Unspecified chronic bronchitis: Secondary | ICD-10-CM | POA: Diagnosis not present

## 2023-05-22 DIAGNOSIS — M85859 Other specified disorders of bone density and structure, unspecified thigh: Secondary | ICD-10-CM | POA: Diagnosis not present

## 2023-05-22 DIAGNOSIS — Z0000 Encounter for general adult medical examination without abnormal findings: Secondary | ICD-10-CM | POA: Diagnosis not present

## 2023-05-22 DIAGNOSIS — E119 Type 2 diabetes mellitus without complications: Secondary | ICD-10-CM | POA: Diagnosis not present

## 2023-05-22 DIAGNOSIS — K649 Unspecified hemorrhoids: Secondary | ICD-10-CM | POA: Diagnosis not present

## 2023-05-22 DIAGNOSIS — Z6830 Body mass index (BMI) 30.0-30.9, adult: Secondary | ICD-10-CM | POA: Diagnosis not present

## 2023-05-22 DIAGNOSIS — J4489 Other specified chronic obstructive pulmonary disease: Secondary | ICD-10-CM | POA: Diagnosis not present

## 2023-05-29 DIAGNOSIS — M5134 Other intervertebral disc degeneration, thoracic region: Secondary | ICD-10-CM | POA: Diagnosis not present

## 2023-05-29 DIAGNOSIS — M7918 Myalgia, other site: Secondary | ICD-10-CM | POA: Diagnosis not present

## 2023-05-29 DIAGNOSIS — M47817 Spondylosis without myelopathy or radiculopathy, lumbosacral region: Secondary | ICD-10-CM | POA: Diagnosis not present

## 2023-05-29 DIAGNOSIS — R29898 Other symptoms and signs involving the musculoskeletal system: Secondary | ICD-10-CM | POA: Diagnosis not present

## 2023-05-29 DIAGNOSIS — M545 Low back pain, unspecified: Secondary | ICD-10-CM | POA: Diagnosis not present

## 2023-05-29 DIAGNOSIS — M549 Dorsalgia, unspecified: Secondary | ICD-10-CM | POA: Diagnosis not present

## 2023-05-29 DIAGNOSIS — M5136 Other intervertebral disc degeneration, lumbar region with discogenic back pain only: Secondary | ICD-10-CM | POA: Diagnosis not present

## 2023-05-29 DIAGNOSIS — G8929 Other chronic pain: Secondary | ICD-10-CM | POA: Diagnosis not present

## 2023-05-29 DIAGNOSIS — M4316 Spondylolisthesis, lumbar region: Secondary | ICD-10-CM | POA: Diagnosis not present

## 2023-05-29 DIAGNOSIS — M4804 Spinal stenosis, thoracic region: Secondary | ICD-10-CM | POA: Diagnosis not present

## 2023-05-29 DIAGNOSIS — M419 Scoliosis, unspecified: Secondary | ICD-10-CM | POA: Diagnosis not present

## 2023-06-02 DIAGNOSIS — E7800 Pure hypercholesterolemia, unspecified: Secondary | ICD-10-CM | POA: Diagnosis not present

## 2023-06-02 DIAGNOSIS — C73 Malignant neoplasm of thyroid gland: Secondary | ICD-10-CM | POA: Diagnosis not present

## 2023-06-02 DIAGNOSIS — E890 Postprocedural hypothyroidism: Secondary | ICD-10-CM | POA: Diagnosis not present

## 2023-06-02 DIAGNOSIS — E1165 Type 2 diabetes mellitus with hyperglycemia: Secondary | ICD-10-CM | POA: Diagnosis not present

## 2023-06-09 DIAGNOSIS — Z780 Asymptomatic menopausal state: Secondary | ICD-10-CM | POA: Diagnosis not present

## 2023-06-09 DIAGNOSIS — K862 Cyst of pancreas: Secondary | ICD-10-CM | POA: Diagnosis not present

## 2023-06-09 DIAGNOSIS — E1165 Type 2 diabetes mellitus with hyperglycemia: Secondary | ICD-10-CM | POA: Diagnosis not present

## 2023-06-09 DIAGNOSIS — M810 Age-related osteoporosis without current pathological fracture: Secondary | ICD-10-CM | POA: Diagnosis not present

## 2023-06-09 DIAGNOSIS — E890 Postprocedural hypothyroidism: Secondary | ICD-10-CM | POA: Diagnosis not present

## 2023-06-09 DIAGNOSIS — J45909 Unspecified asthma, uncomplicated: Secondary | ICD-10-CM | POA: Diagnosis not present

## 2023-06-09 DIAGNOSIS — E7800 Pure hypercholesterolemia, unspecified: Secondary | ICD-10-CM | POA: Diagnosis not present

## 2023-06-09 DIAGNOSIS — Z5320 Procedure and treatment not carried out because of patient's decision for unspecified reasons: Secondary | ICD-10-CM | POA: Diagnosis not present

## 2023-06-09 DIAGNOSIS — C73 Malignant neoplasm of thyroid gland: Secondary | ICD-10-CM | POA: Diagnosis not present

## 2023-06-19 DIAGNOSIS — M79641 Pain in right hand: Secondary | ICD-10-CM | POA: Diagnosis not present

## 2023-06-19 DIAGNOSIS — E663 Overweight: Secondary | ICD-10-CM | POA: Diagnosis not present

## 2023-06-19 DIAGNOSIS — R7982 Elevated C-reactive protein (CRP): Secondary | ICD-10-CM | POA: Diagnosis not present

## 2023-06-19 DIAGNOSIS — M79642 Pain in left hand: Secondary | ICD-10-CM | POA: Diagnosis not present

## 2023-06-19 DIAGNOSIS — Z6830 Body mass index (BMI) 30.0-30.9, adult: Secondary | ICD-10-CM | POA: Diagnosis not present

## 2023-06-19 DIAGNOSIS — G894 Chronic pain syndrome: Secondary | ICD-10-CM | POA: Diagnosis not present

## 2023-07-03 ENCOUNTER — Ambulatory Visit
Admission: RE | Admit: 2023-07-03 | Discharge: 2023-07-03 | Disposition: A | Discharge status: Home / Self Care | Payer: Medicare HMO | Source: Ambulatory Visit | Attending: Pediatrics | Admitting: Pediatrics

## 2023-07-03 DIAGNOSIS — Z1231 Encounter for screening mammogram for malignant neoplasm of breast: Secondary | ICD-10-CM | POA: Diagnosis not present

## 2023-08-21 DIAGNOSIS — E890 Postprocedural hypothyroidism: Secondary | ICD-10-CM | POA: Diagnosis not present

## 2023-09-22 DIAGNOSIS — H2512 Age-related nuclear cataract, left eye: Secondary | ICD-10-CM | POA: Diagnosis not present

## 2023-09-22 DIAGNOSIS — H524 Presbyopia: Secondary | ICD-10-CM | POA: Diagnosis not present

## 2023-09-22 DIAGNOSIS — H04123 Dry eye syndrome of bilateral lacrimal glands: Secondary | ICD-10-CM | POA: Diagnosis not present

## 2023-09-23 DIAGNOSIS — H5203 Hypermetropia, bilateral: Secondary | ICD-10-CM | POA: Diagnosis not present

## 2023-09-23 DIAGNOSIS — H524 Presbyopia: Secondary | ICD-10-CM | POA: Diagnosis not present

## 2023-09-23 DIAGNOSIS — H52209 Unspecified astigmatism, unspecified eye: Secondary | ICD-10-CM | POA: Diagnosis not present

## 2023-10-26 DIAGNOSIS — E890 Postprocedural hypothyroidism: Secondary | ICD-10-CM | POA: Diagnosis not present

## 2023-10-26 DIAGNOSIS — E1165 Type 2 diabetes mellitus with hyperglycemia: Secondary | ICD-10-CM | POA: Diagnosis not present

## 2023-10-26 DIAGNOSIS — E7800 Pure hypercholesterolemia, unspecified: Secondary | ICD-10-CM | POA: Diagnosis not present

## 2023-10-26 DIAGNOSIS — M810 Age-related osteoporosis without current pathological fracture: Secondary | ICD-10-CM | POA: Diagnosis not present

## 2023-10-30 DIAGNOSIS — Z87891 Personal history of nicotine dependence: Secondary | ICD-10-CM | POA: Diagnosis not present

## 2023-10-30 DIAGNOSIS — J449 Chronic obstructive pulmonary disease, unspecified: Secondary | ICD-10-CM | POA: Diagnosis not present

## 2023-11-02 DIAGNOSIS — Z780 Asymptomatic menopausal state: Secondary | ICD-10-CM | POA: Diagnosis not present

## 2023-11-02 DIAGNOSIS — C73 Malignant neoplasm of thyroid gland: Secondary | ICD-10-CM | POA: Diagnosis not present

## 2023-11-02 DIAGNOSIS — M810 Age-related osteoporosis without current pathological fracture: Secondary | ICD-10-CM | POA: Diagnosis not present

## 2023-11-02 DIAGNOSIS — E890 Postprocedural hypothyroidism: Secondary | ICD-10-CM | POA: Diagnosis not present

## 2023-11-02 DIAGNOSIS — E7800 Pure hypercholesterolemia, unspecified: Secondary | ICD-10-CM | POA: Diagnosis not present

## 2023-11-02 DIAGNOSIS — E1165 Type 2 diabetes mellitus with hyperglycemia: Secondary | ICD-10-CM | POA: Diagnosis not present

## 2024-01-01 ENCOUNTER — Other Ambulatory Visit: Payer: Medicare HMO

## 2024-01-01 ENCOUNTER — Ambulatory Visit (HOSPITAL_BASED_OUTPATIENT_CLINIC_OR_DEPARTMENT_OTHER)
Admission: RE | Admit: 2024-01-01 | Discharge: 2024-01-01 | Disposition: A | Discharge status: Home / Self Care | Source: Ambulatory Visit | Attending: Pediatrics | Admitting: Pediatrics

## 2024-01-01 DIAGNOSIS — M810 Age-related osteoporosis without current pathological fracture: Secondary | ICD-10-CM | POA: Diagnosis not present

## 2024-01-01 DIAGNOSIS — Z780 Asymptomatic menopausal state: Secondary | ICD-10-CM | POA: Insufficient documentation

## 2024-01-01 DIAGNOSIS — Z13820 Encounter for screening for osteoporosis: Secondary | ICD-10-CM | POA: Insufficient documentation

## 2024-01-01 DIAGNOSIS — M85859 Other specified disorders of bone density and structure, unspecified thigh: Secondary | ICD-10-CM | POA: Insufficient documentation

## 2024-02-01 DIAGNOSIS — E890 Postprocedural hypothyroidism: Secondary | ICD-10-CM | POA: Diagnosis not present

## 2024-02-01 DIAGNOSIS — E1165 Type 2 diabetes mellitus with hyperglycemia: Secondary | ICD-10-CM | POA: Diagnosis not present

## 2024-02-01 DIAGNOSIS — E7800 Pure hypercholesterolemia, unspecified: Secondary | ICD-10-CM | POA: Diagnosis not present

## 2024-02-08 ENCOUNTER — Emergency Department (HOSPITAL_COMMUNITY)
Admission: EM | Admit: 2024-02-08 | Discharge: 2024-02-08 | Discharge status: Against Medical Advice | Attending: Student | Admitting: Student

## 2024-02-08 ENCOUNTER — Emergency Department (HOSPITAL_COMMUNITY)

## 2024-02-08 ENCOUNTER — Other Ambulatory Visit: Payer: Self-pay

## 2024-02-08 DIAGNOSIS — Z780 Asymptomatic menopausal state: Secondary | ICD-10-CM | POA: Diagnosis not present

## 2024-02-08 DIAGNOSIS — R0602 Shortness of breath: Secondary | ICD-10-CM | POA: Diagnosis not present

## 2024-02-08 DIAGNOSIS — Z5321 Procedure and treatment not carried out due to patient leaving prior to being seen by health care provider: Secondary | ICD-10-CM | POA: Diagnosis not present

## 2024-02-08 DIAGNOSIS — E890 Postprocedural hypothyroidism: Secondary | ICD-10-CM | POA: Diagnosis not present

## 2024-02-08 DIAGNOSIS — C73 Malignant neoplasm of thyroid gland: Secondary | ICD-10-CM | POA: Diagnosis not present

## 2024-02-08 DIAGNOSIS — R252 Cramp and spasm: Secondary | ICD-10-CM | POA: Diagnosis not present

## 2024-02-08 DIAGNOSIS — M810 Age-related osteoporosis without current pathological fracture: Secondary | ICD-10-CM | POA: Diagnosis not present

## 2024-02-08 DIAGNOSIS — F419 Anxiety disorder, unspecified: Secondary | ICD-10-CM | POA: Diagnosis not present

## 2024-02-08 DIAGNOSIS — E7800 Pure hypercholesterolemia, unspecified: Secondary | ICD-10-CM | POA: Diagnosis not present

## 2024-02-08 DIAGNOSIS — J449 Chronic obstructive pulmonary disease, unspecified: Secondary | ICD-10-CM | POA: Diagnosis not present

## 2024-02-08 DIAGNOSIS — R0600 Dyspnea, unspecified: Secondary | ICD-10-CM | POA: Diagnosis not present

## 2024-02-08 DIAGNOSIS — Z5320 Procedure and treatment not carried out because of patient's decision for unspecified reasons: Secondary | ICD-10-CM | POA: Diagnosis not present

## 2024-02-08 DIAGNOSIS — E1165 Type 2 diabetes mellitus with hyperglycemia: Secondary | ICD-10-CM | POA: Diagnosis not present

## 2024-02-08 LAB — CBC WITH DIFFERENTIAL/PLATELET
Abs Immature Granulocytes: 0.07 K/uL (ref 0.00–0.07)
Basophils Absolute: 0.1 K/uL (ref 0.0–0.1)
Basophils Relative: 1 %
Eosinophils Absolute: 0 K/uL (ref 0.0–0.5)
Eosinophils Relative: 0 %
HCT: 45.9 % (ref 36.0–46.0)
Hemoglobin: 14.8 g/dL (ref 12.0–15.0)
Immature Granulocytes: 1 %
Lymphocytes Relative: 12 %
Lymphs Abs: 1 K/uL (ref 0.7–4.0)
MCH: 26.8 pg (ref 26.0–34.0)
MCHC: 32.2 g/dL (ref 30.0–36.0)
MCV: 83.2 fL (ref 80.0–100.0)
Monocytes Absolute: 0.3 K/uL (ref 0.1–1.0)
Monocytes Relative: 3 %
Neutro Abs: 7.2 K/uL (ref 1.7–7.7)
Neutrophils Relative %: 83 %
Platelets: 369 K/uL (ref 150–400)
RBC: 5.52 MIL/uL — ABNORMAL HIGH (ref 3.87–5.11)
RDW: 14.5 % (ref 11.5–15.5)
WBC: 8.6 K/uL (ref 4.0–10.5)
nRBC: 0 % (ref 0.0–0.2)

## 2024-02-08 LAB — COMPREHENSIVE METABOLIC PANEL WITH GFR
ALT: 29 U/L (ref 0–44)
AST: 33 U/L (ref 15–41)
Albumin: 3.6 g/dL (ref 3.5–5.0)
Alkaline Phosphatase: 82 U/L (ref 38–126)
Anion gap: 15 (ref 5–15)
BUN: 11 mg/dL (ref 8–23)
CO2: 19 mmol/L — ABNORMAL LOW (ref 22–32)
Calcium: 9.5 mg/dL (ref 8.9–10.3)
Chloride: 101 mmol/L (ref 98–111)
Creatinine, Ser: 1.27 mg/dL — ABNORMAL HIGH (ref 0.44–1.00)
GFR, Estimated: 47 mL/min — ABNORMAL LOW (ref >60)
Glucose, Bld: 181 mg/dL — ABNORMAL HIGH (ref 70–99)
Potassium: 3.5 mmol/L (ref 3.5–5.1)
Sodium: 135 mmol/L (ref 135–145)
Total Bilirubin: 0.4 mg/dL (ref 0.0–1.2)
Total Protein: 7.9 g/dL (ref 6.5–8.1)

## 2024-02-08 LAB — TROPONIN I (HIGH SENSITIVITY): Troponin I (High Sensitivity): 7 ng/L (ref <18)

## 2024-02-08 LAB — BRAIN NATRIURETIC PEPTIDE: B Natriuretic Peptide: 53.5 pg/mL (ref 0.0–100.0)

## 2024-02-08 MED ORDER — DIPHENHYDRAMINE HCL 50 MG/ML IJ SOLN
25.0000 mg | Freq: Once | INTRAMUSCULAR | Status: AC
  Administered 2024-02-08: 25 mg via INTRAMUSCULAR
  Filled 2024-02-08: qty 1

## 2024-02-08 MED ORDER — PROCHLORPERAZINE EDISYLATE 10 MG/2ML IJ SOLN
10.0000 mg | Freq: Once | INTRAMUSCULAR | Status: AC
  Administered 2024-02-08: 10 mg via INTRAMUSCULAR
  Filled 2024-02-08: qty 2

## 2024-02-08 MED ORDER — LORAZEPAM 1 MG PO TABS
1.0000 mg | ORAL_TABLET | Freq: Once | ORAL | Status: AC
  Administered 2024-02-08: 1 mg via ORAL
  Filled 2024-02-08: qty 1

## 2024-02-08 NOTE — ED Notes (Signed)
 Pt taken to triage and placed on 2lpm [place]. Pt yelling at staff saying she can't breath

## 2024-02-08 NOTE — ED Triage Notes (Signed)
 Patient yelling and screaming in lobby stating she can't breathe but is hyperventilating and sats were 97% on RA when NT checked it.

## 2024-02-08 NOTE — ED Provider Triage Note (Signed)
 Emergency Medicine Provider Triage Evaluation Note  MEILING HENDRIKS , a 68 y.o. female  was evaluated in triage.  Pt complains of right hand cramping, shortness of breath.  States that she has been feeling poorly over the last 1 week and arrives very anxious, agitated.  States that she tried to see her outpatient providers but was unable to get into clinic.  Has known history of COPD on 2 L chronically.  Denies chest pain, abdominal pain, nausea, vomiting, headache, fever or other systemic symptoms  Review of Systems  Positive: Agitation, anxiety, hand cramping, shortness of breath Negative: Chest pain, abdominal pain, nausea, vomiting  Physical Exam  BP (!) 130/93 (BP Location: Right Arm)   Pulse (!) 117   Resp (!) 34   SpO2 98%  Gen:   Awake, very agitated Resp:   tachypneic MSK:   Moves extremities without difficulty  Other:    Medical Decision Making  Medically screening exam initiated at 10:57 AM.  Appropriate orders placed.  Ajee Heasley Dekay was informed that the remainder of the evaluation will be completed by another provider, this initial triage assessment does not replace that evaluation, and the importance of remaining in the ED until their evaluation is complete.     Albertina Dixon, MD 02/08/24 1058

## 2024-02-08 NOTE — ED Notes (Signed)
 Pt unable to be loctaed in lobby. Found her wheelchair and stickers laying lin lobby.

## 2024-02-08 NOTE — ED Triage Notes (Addendum)
 Pt. Stated, I felt bad yesterday and I just laid on the couch all day.  Im SOB, Feeling achiness, nausea, . My hand and arm feels like it is cramping. Pt is yelling at everyone, trying to calm her down.

## 2024-02-08 NOTE — ED Notes (Signed)
 Called patient to see if she was still in the hospital reports she couldn't stay any longer and neeeded to go home and lay down.  Reports she is going to try and get in touch with  her PCP tomorrow.

## 2024-04-29 DIAGNOSIS — Z0000 Encounter for general adult medical examination without abnormal findings: Secondary | ICD-10-CM | POA: Diagnosis not present

## 2024-04-30 DIAGNOSIS — J449 Chronic obstructive pulmonary disease, unspecified: Secondary | ICD-10-CM | POA: Diagnosis not present

## 2024-04-30 DIAGNOSIS — E669 Obesity, unspecified: Secondary | ICD-10-CM | POA: Diagnosis not present

## 2024-04-30 DIAGNOSIS — Z6830 Body mass index (BMI) 30.0-30.9, adult: Secondary | ICD-10-CM | POA: Diagnosis not present

## 2024-04-30 DIAGNOSIS — E119 Type 2 diabetes mellitus without complications: Secondary | ICD-10-CM | POA: Diagnosis not present

## 2024-04-30 DIAGNOSIS — E890 Postprocedural hypothyroidism: Secondary | ICD-10-CM | POA: Diagnosis not present

## 2024-04-30 DIAGNOSIS — M159 Polyosteoarthritis, unspecified: Secondary | ICD-10-CM | POA: Diagnosis not present

## 2024-05-13 DIAGNOSIS — M810 Age-related osteoporosis without current pathological fracture: Secondary | ICD-10-CM | POA: Diagnosis not present

## 2024-05-13 DIAGNOSIS — R030 Elevated blood-pressure reading, without diagnosis of hypertension: Secondary | ICD-10-CM | POA: Diagnosis not present

## 2024-05-13 DIAGNOSIS — N1831 Chronic kidney disease, stage 3a: Secondary | ICD-10-CM | POA: Diagnosis not present

## 2024-05-13 DIAGNOSIS — Z823 Family history of stroke: Secondary | ICD-10-CM | POA: Diagnosis not present

## 2024-05-13 DIAGNOSIS — E890 Postprocedural hypothyroidism: Secondary | ICD-10-CM | POA: Diagnosis not present

## 2024-05-13 DIAGNOSIS — I700 Atherosclerosis of aorta: Secondary | ICD-10-CM | POA: Diagnosis not present

## 2024-05-13 DIAGNOSIS — J4489 Other specified chronic obstructive pulmonary disease: Secondary | ICD-10-CM | POA: Diagnosis not present

## 2024-05-13 DIAGNOSIS — E785 Hyperlipidemia, unspecified: Secondary | ICD-10-CM | POA: Diagnosis not present

## 2024-05-13 DIAGNOSIS — Z85850 Personal history of malignant neoplasm of thyroid: Secondary | ICD-10-CM | POA: Diagnosis not present

## 2024-05-13 DIAGNOSIS — E1122 Type 2 diabetes mellitus with diabetic chronic kidney disease: Secondary | ICD-10-CM | POA: Diagnosis not present

## 2024-05-13 DIAGNOSIS — Z791 Long term (current) use of non-steroidal anti-inflammatories (NSAID): Secondary | ICD-10-CM | POA: Diagnosis not present

## 2024-05-13 DIAGNOSIS — M545 Low back pain, unspecified: Secondary | ICD-10-CM | POA: Diagnosis not present

## 2024-05-13 DIAGNOSIS — F419 Anxiety disorder, unspecified: Secondary | ICD-10-CM | POA: Diagnosis not present

## 2024-05-13 DIAGNOSIS — K5900 Constipation, unspecified: Secondary | ICD-10-CM | POA: Diagnosis not present

## 2024-05-13 DIAGNOSIS — Z7985 Long-term (current) use of injectable non-insulin antidiabetic drugs: Secondary | ICD-10-CM | POA: Diagnosis not present

## 2024-05-13 DIAGNOSIS — Z79890 Hormone replacement therapy (postmenopausal): Secondary | ICD-10-CM | POA: Diagnosis not present

## 2024-05-13 DIAGNOSIS — Z9181 History of falling: Secondary | ICD-10-CM | POA: Diagnosis not present

## 2024-05-13 DIAGNOSIS — M1990 Unspecified osteoarthritis, unspecified site: Secondary | ICD-10-CM | POA: Diagnosis not present

## 2024-06-10 DIAGNOSIS — R300 Dysuria: Secondary | ICD-10-CM | POA: Diagnosis not present
